# Patient Record
Sex: Female | Born: 1990 | Race: Black or African American | Hispanic: No | Marital: Single | Smoking: Former smoker
Health system: Southern US, Community
[De-identification: ages and names within clinical notes are randomized; demographics above are authoritative.]

## PROBLEM LIST (undated history)

## (undated) ENCOUNTER — Inpatient Hospital Stay (HOSPITAL_COMMUNITY): Payer: Self-pay

## (undated) DIAGNOSIS — F419 Anxiety disorder, unspecified: Secondary | ICD-10-CM

## (undated) DIAGNOSIS — N83209 Unspecified ovarian cyst, unspecified side: Secondary | ICD-10-CM

## (undated) DIAGNOSIS — A749 Chlamydial infection, unspecified: Secondary | ICD-10-CM

## (undated) DIAGNOSIS — D649 Anemia, unspecified: Secondary | ICD-10-CM

## (undated) DIAGNOSIS — G43909 Migraine, unspecified, not intractable, without status migrainosus: Secondary | ICD-10-CM

## (undated) DIAGNOSIS — K219 Gastro-esophageal reflux disease without esophagitis: Secondary | ICD-10-CM

## (undated) HISTORY — DX: Migraine, unspecified, not intractable, without status migrainosus: G43.909

## (undated) HISTORY — DX: Unspecified ovarian cyst, unspecified side: N83.209

## (undated) HISTORY — DX: Anxiety disorder, unspecified: F41.9

---
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
## 2011-04-05 ENCOUNTER — Emergency Department (HOSPITAL_COMMUNITY)
Admission: EM | Admit: 2011-04-05 | Discharge: 2011-04-05 | Discharge status: Against Medical Advice | Payer: Self-pay | Attending: Emergency Medicine | Admitting: Emergency Medicine

## 2011-04-05 ENCOUNTER — Encounter (HOSPITAL_COMMUNITY): Payer: Self-pay | Admitting: *Deleted

## 2011-04-05 DIAGNOSIS — R0609 Other forms of dyspnea: Secondary | ICD-10-CM | POA: Insufficient documentation

## 2011-04-05 DIAGNOSIS — R0989 Other specified symptoms and signs involving the circulatory and respiratory systems: Secondary | ICD-10-CM | POA: Insufficient documentation

## 2011-04-05 NOTE — ED Notes (Signed)
Pt states "last night I felt like I was unable to breath, I don't have asthma or anything, I did have it today once around 1 or 2om

## 2011-09-15 ENCOUNTER — Ambulatory Visit: Payer: Medicaid Other | Admitting: Advanced Practice Midwife

## 2011-09-23 ENCOUNTER — Encounter: Payer: Self-pay | Admitting: Obstetrics & Gynecology

## 2011-09-23 ENCOUNTER — Ambulatory Visit (INDEPENDENT_AMBULATORY_CARE_PROVIDER_SITE_OTHER): Payer: Medicaid Other | Admitting: Obstetrics & Gynecology

## 2011-09-23 ENCOUNTER — Other Ambulatory Visit: Payer: Self-pay | Admitting: Obstetrics & Gynecology

## 2011-09-23 VITALS — BP 114/66 | HR 89 | Temp 97.2°F | Ht 67.0 in | Wt 175.1 lb

## 2011-09-23 DIAGNOSIS — R102 Pelvic and perineal pain unspecified side: Secondary | ICD-10-CM | POA: Insufficient documentation

## 2011-09-23 DIAGNOSIS — B373 Candidiasis of vulva and vagina: Secondary | ICD-10-CM

## 2011-09-23 DIAGNOSIS — N949 Unspecified condition associated with female genital organs and menstrual cycle: Secondary | ICD-10-CM

## 2011-09-23 LAB — POCT URINALYSIS DIP (DEVICE)
Bilirubin Urine: NEGATIVE
Glucose, UA: NEGATIVE mg/dL
Ketones, ur: NEGATIVE mg/dL
Specific Gravity, Urine: 1.02 (ref 1.005–1.030)

## 2011-09-23 MED ORDER — TERCONAZOLE 0.4 % VA CREA: 1.0000 | TOPICAL_CREAM | Freq: Every day | VAGINAL | Status: AC

## 2011-09-23 NOTE — Patient Instructions (Signed)
Pelvic Pain in Women, Generic  Pelvic pain may be constant or come and go. It may be mild or severe. It is important to tell your caregiver exactly where the pain is located, when and how it occurs, and if it is related to your menstrual periods or stress. We have not found a definite cause for your pelvic pain today and you may need follow-up testing and examination.  CAUSES    Sexually transmitted diseases (STDS) cause pelvic inflammatory disease (PID). This is one of the most common causes of pelvic pain. It is an infection of the female sexual organs.   Endometriosis - This is a condition where some of the inside lining of the uterus is growing in the pelvis and abdomen outside the uterus. Along with (chronic) pain, this can cause infertility.   Tubal pregnancy - This is a serious condition where the pregnancy has occurred in a fallopian tube. Rupture of the tube can bleed heavily and cause death if it is not found in time.   Interstitial cystitis is an inflammation of the bladder that causes pelvic pain. People with severe cases of IC may urinate as many as 60 times a day.   Fibroids: A small percentage of women have uterine fibroids (non-cancerous smooth muscle growths in the uterus). Fibroids do not always cause pain.   Fibromyalgia is a disorder with symptoms of widespread muscle pain, fatigue and multiple tender points on the body.   Dysmenorrhea is painful menstrual periods.   Mittlesmertz is pain with ovulation.   Pelvic congestive syndrome, is engorgement of the pelvic veins just before and during a menstrual period.   Cervical stenosis is when the opening of the cervix is too small and causes pain during menstruation.   Adenomyosis (a type of endometriosis) glands that line the inside of the uterus lying in the muscle layer of the uterus.   Intestinal problems such as irritable bowel syndrome colitis or ileitis.   Appendicitis.   Pelvic cancer. Usually the cancer has been there for awhile  before causing pain.   Bladder infection.   Cysts or ovarian tumors.   Kidney stone.   Psychological factors (stress, sexual abuse or depression).   IUD (intrauterine device).   Prolapse (falling down of the uterus).   Retroflexed uterus - the uterus is tipped too far backwards.   Muscle spasms of the pelvic muscles.   Muscular-skeletal problems of the back (herniated disc).  DIAGNOSIS    Your caregiver may order testing, such as:   Blood tests.   Cultures to test for infection.   Ultrasound.   Looking into the bladder with a metal tube with a light (cystoscopy).   Looking into the pelvis and abdomen with very small incisions through a metal tube with a light (laparoscopy).   Looking into the large intestine with a fibro-optic tube with a light (colonoscopy).   CT scan - a type of X-ray to view the internal organs of the pelvis and abdomen.   MRI - views the pelvic and abdominal organs with a magnetic machine.   Intravenous pyelogram - views the kidneys, ureter and bladder after injecting dye through the vein by X-rays.   Injecting barium into the large intestine to view the intestine with X-rays (barium enema).   Not all test results are available during your visit. If your test results are not back during the visit, make an appointment with your caregiver or the medical facility. It is important for you to follow up   on all of your test results.  TREATMENT   Treatment will depend on the cause of the pain, such as:   Medication, antibiotics, pain medication, muscle relaxants, anti-depression drugs, hormones or birth control pills.   Physical therapy.   Acupuncture.   Psychiatric counseling.   Nerve blocks.   Surgery.  HOME CARE INSTRUCTIONS    Finish all medication as prescribed. Incomplete treatment will put you at risk for sterility and tubal pregnancy if your caregiver feels your pain is caused by an infection.   Rest and eat a balanced diet with plenty of fluids.   If you do have an  infection, your recent sexual partners may need treatment even if they are symptom-free or have a negative culture or evaluation. You also need follow-up to make sure you are no longer infected.   Only take over-the-counter or prescription medicines for pain, discomfort or fever as directed by your caregiver.   Apply warm or cold compresses to the lower abdomen depending on which one helps the pain.   Avoid stressful situations that may cause the pain.   Group therapy is sometimes helpful.   Make sure to follow all instructions. Some of the conditions listed above can have very serious outcomes if you do not take the time to follow-up with your caregiver.  SEEK IMMEDIATE MEDICAL CARE IF:    There is heavier bleeding from the birth canal (vagina).   You develop increasing abdominal pain.   You feel lightheaded or pass out.   An unexplained oral temperature above 102 F (38.9 C) develops.   Any of the problems which brought you to us are getting worse.   You are being physically or sexually abused.   You have painful urination.   You are still having pain four hours after taking prescription pain medication.   You have uncontrolled diarrhea.   You have abnormal vaginal discharge.  Document Released: 01/19/2004 Document Revised: 02/10/2011 Document Reviewed: 02/19/2008  ExitCare Patient Information 2012 [employer].

## 2011-09-23 NOTE — Progress Notes (Signed)
Patient ID: Veronica Hines, female   DOB: 1990/11/06, 21 y.o.   MRN: [id number]  Chief Complaint  Patient presents with  . Pelvic Pain    HPI Veronica Hines is a 21 y.o. female.  LMP 09/09/11, Implanon removed in Jan. Intermittent pelvic pain since then. Irregular periods. Vaginal itching, discharge HPI  Past Medical History  Diagnosis Date  . Ovarian cyst     History reviewed. No pertinent past surgical history.  Family History  Problem Relation Age of Onset  . Diabetes Maternal Grandmother   . Cancer Maternal Grandfather     lung    Social History History  Substance Use Topics  . Smoking status: Passive Smoker  . Smokeless tobacco: Never Used  . Alcohol Use: No    No Known Allergies  Current Outpatient Prescriptions  Medication Sig Dispense Refill  . Multiple Vitamin (MULTIVITAMIN) capsule Take 1 capsule by mouth daily. Women's one a day      . METROGEL VAGINAL 0.75 % vaginal gel 1 applicatorful per vagina q HS X5 days  70 g  1  . terconazole (TERAZOL 7) 0.4 % vaginal cream Place 1 applicator vaginally at bedtime.  45 g  0  . Vaginal Lubricant (REPHRESH) GEL Place 1 Applicatorful vaginally 3 (three) times a week.  2 g  prn    Review of Systems Review of Systems  Constitutional: Negative.   Respiratory: Negative.   Gastrointestinal: Negative for vomiting, constipation and abdominal distention.  Genitourinary: Positive for urgency, vaginal discharge, menstrual problem and pelvic pain.    Blood pressure 114/66, pulse 89, temperature 97.2 F (36.2 C), temperature source Oral, height 5\' 7"  (1.702 m), weight 175 lb 1.6 oz (79.425 kg), last menstrual period 09/09/2011.  Physical Exam Physical Exam  Constitutional: She is oriented to person, place, and time. She appears well-developed. No distress.  Abdominal: Soft. She exhibits no distension and no mass. There is no tenderness.  Genitourinary: Vaginal discharge found.       White vaginal discharge, c/w yeast. Cx  nl, NT non mass. STD probe and wet prep  Neurological: She is alert and oriented to person, place, and time.  Skin: Skin is warm and dry.  Psychiatric: She has a normal mood and affect. Her behavior is normal.    Data Reviewed [place] 08/2010  Assessment   Yeast vaginitis Pelvic pain. Wants to conceive. US pelvis, Terazol vaginal, RTC 4 weeks    Plan    4 week RTC. Check urine culture.       Veronica Hines 09/23/2011, 9:13 AM

## 2011-09-24 LAB — URINALYSIS, ROUTINE W REFLEX MICROSCOPIC
Glucose, UA: NEGATIVE mg/dL
Leukocytes, UA: NEGATIVE
Nitrite: NEGATIVE
Specific Gravity, Urine: 1.019 (ref 1.005–1.030)
pH: 7 (ref 5.0–8.0)

## 2011-09-24 LAB — WET PREP, GENITAL

## 2011-09-24 LAB — GC/CHLAMYDIA PROBE AMP, GENITAL: GC Probe Amp, Genital: NEGATIVE

## 2011-09-25 LAB — CULTURE, URINE COMPREHENSIVE: Colony Count: 75000

## 2011-10-21 ENCOUNTER — Ambulatory Visit: Payer: Medicaid Other | Admitting: Obstetrics & Gynecology

## 2011-11-11 ENCOUNTER — Encounter: Payer: Self-pay | Admitting: Obstetrics & Gynecology

## 2011-11-11 ENCOUNTER — Ambulatory Visit (INDEPENDENT_AMBULATORY_CARE_PROVIDER_SITE_OTHER): Payer: Medicaid Other | Admitting: Obstetrics & Gynecology

## 2011-11-11 VITALS — BP 124/74 | HR 103 | Temp 98.2°F | Ht 67.0 in | Wt 178.0 lb

## 2011-11-11 DIAGNOSIS — A46 Erysipelas: Secondary | ICD-10-CM

## 2011-11-11 DIAGNOSIS — N760 Acute vaginitis: Secondary | ICD-10-CM

## 2011-11-11 MED ORDER — SULFAMETHOXAZOLE-TRIMETHOPRIM 800-160 MG PO TABS: 1.0000 | ORAL_TABLET | Freq: Two times a day (BID) | ORAL | Status: AC

## 2011-11-11 NOTE — Patient Instructions (Signed)
Staphylococcal Infections  Staphylococcus aureus (Staph) is a germ that may cause infections, especially on broken skin or wounds. Methicillin is a drug sometimes used to treat Staph infections. If the germ is resistant to methicillin, it is called MRSA. Methicillin and some other drugs may not work to treat the infection. However, there are other antibiotic drugs that may be used that will treat the infection. Your caregiver may do a culture from your wound, skin, or other site. This will tell him/her that you have MRSA present. Sometimes healthy people carry MRSA, but it may also cause an infection.  Staph infections, including MRSA can spread from one person to another by contact with an infected person. You may prevent spreading an MRSA infection to those you live with or others around you by following these steps:  Keep infections and pus or drainage material covered with clean dry bandages. Follow your caregiver's instructions on proper care of the wound. Pus from infected wounds can contain MRSA and spread the germ (bacteria) to others.   Advise your family and other close contacts to wash their hands frequently with soap and warm water. This should be done especially if they change your bandages or touch the infected wound or infectious materials.   Avoid sharing personal items (towels, washcloth, razor, clothing, or uniforms) that may have had contact with the infected wound.   Wash linens and clothes that become soiled, with hot water and laundry detergent. Drying clothes in a hot dryer, rather than air-drying, also helps kill bacteria in clothes.   Tell any healthcare providers who treat you that you have an MRSA infection. In the hospital steps will be taken to prevent the spread of MRSA.   Ask your caregiver about return to school or return to work if you have a Staph or MRSA infection.   If your caregiver has given you a follow-up appointment, it is very important to keep that  appointment. Not keeping the appointment could result in a chronic or permanent injury, pain, and disability. If there is any problem keeping the appointment, you must call back to this facility for assistance.  Staph and MRSA infections can become very serious and you should contact your caregiver if your infection gets worse. To fight the infection, follow your doctor's instructions for wound care and take all medicines as prescribed. SEEK MEDICAL CARE IF:   You have increased pus coming from the wound.   You have a fever.   You notice a bad smell coming from the wound or dressing.  SEEK IMMEDIATE MEDICAL CARE IF:  You have redness, red streaks, swelling, or increasing pain in the wound. Document Released: 05/14/2002 Document Revised: 02/10/2011 Document Reviewed: 10/08/2007 ExitCare Patient Information 2012 [employer]. 

## 2011-11-11 NOTE — Progress Notes (Signed)
Subjective:     Patient ID: Veronica Hines, female   DOB: February 11, 1991, 21 y.o.   MRN: [id number]  HPI  Pt here for f/u of pelvic pain which has completely resolved.  Pt noted an infected bug bite on her face since yesterday.  No fever or chills.   Pt c/o occ odor vaginally. None currently.  Dx'd with a yeast infxn several months ago but, did not get rx called in pt unsure if she still has infxn.  No abnl discharge or sx.     Review of Systems n/c     Objective:   Physical ExamBP 124/74  Pulse 103  Temp 98.2 F (36.8 C) (Oral)  Ht 5\' 7"  (1.702 m)  Wt 178 lb (80.74 kg)  BMI 27.88 kg/m2  LMP 10/25/2011  HEENT: honey crusted lesion on right cheek surrounded by erythema     Assessment:     Patch of erysipelas on face Allergic vaginitis- reviewed normal physiologic discharge   Plan:     Bactrim DS 1 po bid x 7 days F/u 1 year for annual reveiwed with pt GO WHITE  Christopherjame Carnell L. Harraway-Smith, M.D., [place]

## 2011-12-30 ENCOUNTER — Emergency Department (INDEPENDENT_AMBULATORY_CARE_PROVIDER_SITE_OTHER)
Admission: EM | Admit: 2011-12-30 | Discharge: 2011-12-30 | Disposition: A | Discharge status: Home / Self Care | Payer: Self-pay | Source: Home / Self Care | Attending: Emergency Medicine | Admitting: Emergency Medicine

## 2011-12-30 ENCOUNTER — Encounter (HOSPITAL_COMMUNITY): Payer: Self-pay | Admitting: *Deleted

## 2011-12-30 DIAGNOSIS — R202 Paresthesia of skin: Secondary | ICD-10-CM

## 2011-12-30 DIAGNOSIS — R209 Unspecified disturbances of skin sensation: Secondary | ICD-10-CM

## 2011-12-30 LAB — GLUCOSE, CAPILLARY: Glucose-Capillary: 82 mg/dL (ref 70–99)

## 2011-12-30 NOTE — ED Notes (Signed)
[facility] called about pt. with L arm and hand numbness.  [facility] Alert EMT sent to waiting room to check pt. Veronica Hines M

## 2011-12-30 NOTE — ED Notes (Signed)
Pt  Reports   Had      Episode  Of  l     Arm  Tingling         With  Some  Numbness         Today        denys  Any  Injury    -  She  Reports  The  Pain radiates     Up  The  Arm          She       Reports   She   Had  Similar  Episode in  The  Past  Which  Resolved             She  Appears  Pleasant  And  Is  In no  Distress

## 2011-12-30 NOTE — ED Provider Notes (Signed)
History     CSN: [id number]  Arrival date & time 12/30/11  1604   First MD Initiated Contact with Patient 12/30/11 1609      Chief Complaint  Patient presents with  . Numbness    (Consider location/radiation/quality/duration/timing/severity/associated sxs/prior treatment) HPI Comments: Patient presents urgent care tonight describing several days of ongoing numbness and tingling sensations on her left upper extremity most noticeable today. She describes she works in a [employer], [employer],  For many hours. And she also theshe has had this sensations before and they have gone away on their own. At this point patient denies any other further symptoms such as weakness of her left upper extremity or opposite arm. No lower extremity weakness, no speech problems, no headaches, no visual changes, no facial paresthesias, patient also denies any recent trauma to her left upper extremity denies any wrist elbow or shoulder pain. Patient also denies constitutional symptoms such as fevers, myalgias arthralgias, unintentional weight loss, changes in appetite or frequent headaches. She's not certain about what triggers this but he seems to go away for hours or minutes at a time in fact at the moment of interview and physical exam she was not feeling any abnormality.  The history is provided by the patient.    Past Medical History  Diagnosis Date  . Ovarian cyst     History reviewed. No pertinent past surgical history.  Family History  Problem Relation Age of Onset  . Diabetes Maternal Grandmother   . Cancer Maternal Grandfather     lung    History  Substance Use Topics  . Smoking status: Passive Smoke Exposure - Never Smoker  . Smokeless tobacco: Never Used  . Alcohol Use: No    OB History    Grav Para Term Preterm Abortions TAB SAB Ect Mult Living   0               Review of Systems  Constitutional: Negative for fever, chills, activity change, appetite change and fatigue.    Musculoskeletal: Positive for extremity weakness. Negative for myalgias, back pain, joint swelling and arthralgias.  Skin: Negative for color change, rash and wound.  Neurological: Positive for numbness. Negative for dizziness, tremors, syncope, facial asymmetry, speech difficulty, weakness and headaches.  Psychiatric/Behavioral: Negative for behavioral problems and agitation.    Allergies  Review of patient's allergies indicates no known allergies.  Home Medications   Current Outpatient Rx  Name Route Sig Dispense Refill  . METROGEL-VAGINAL 0.75 % VA GEL  1 applicatorful per vagina q HS X5 days 70 g 1    Dispense as written.  . MULTIVITAMINS PO CAPS Oral Take 1 capsule by mouth daily. Women's one a day    . REPHRESH VA GEL Vaginal Place 1 Applicatorful vaginally 3 (three) times a week. 2 g prn    BP 118/65  Pulse 86  Temp 98.2 F (36.8 C) (Oral)  Resp 16  SpO2 100%  LMP 12/08/2011  Physical Exam  Nursing note and vitals reviewed. Constitutional: She is oriented to person, place, and time. She appears well-developed and well-nourished.  Neck: Normal range of motion. Neck supple. No JVD present.  Musculoskeletal: She exhibits no edema and no tenderness.  Lymphadenopathy:    She has no cervical adenopathy.  Neurological: She is alert and oriented to person, place, and time. She has normal strength. She displays no atrophy, no tremor and normal reflexes. No cranial nerve deficit or sensory deficit. She exhibits normal muscle tone. She displays a negative  Romberg sign. Coordination and gait normal. GCS eye subscore is 4. GCS verbal subscore is 5. GCS motor subscore is 6.  Reflex Scores:      Brachioradialis reflexes are 3+ on the right side and 3+ on the left side.      Further sensorial exam patient has conserved, proprioception, able to discriminate 2 point tactile discrimination, able to perceive pressure, and temperature changes. Patient has a normal biceps and triceps muscular  strength. Is able to flex and extend her wrist and supinate and pronate her elbow without difficulty  Skin: No rash noted. No erythema.    ED Course  Procedures (including critical care time)  Labs Reviewed - No data to display No results found.   1. Paresthesia of arm       MDM  Possibly paresthesia to left upper extremity (recurrent), secondary to over usage of left upper extremity. Patient had no further neurological symptoms and had a normal neurological and musculoskeletal exam. In fact that point of evaluation patient seemed to be asymptomatic as per own report, denies any weakness or pain. Have encouraged patient to followup with neurologist if her pains persist and she's unable to elicit any pain or discomfort in any of her upper extremity joints and neck. She agrees with plan of care and followup care as necessary with either her primary care Dr. or the neurologist. We also discussed in detail the symptoms were to constitute red flags for an immediate evaluation in the emergency department she acknowledged and understands and will pursue such course if any other new symptoms     Jimmie Molly, MD 12/30/11 1731

## 2011-12-30 NOTE — ED Notes (Addendum)
Pt assessed in response to registration request due to c/o lt arm numbness. When pt was questioned about symptoms she explained that she now has tingling hand and fingers, but that numb/tingling sensation was higher in her arm earlier today. Pt. Complains of no other symptoms.

## 2012-02-27 ENCOUNTER — Ambulatory Visit (INDEPENDENT_AMBULATORY_CARE_PROVIDER_SITE_OTHER): Payer: Medicaid Other | Admitting: Family Medicine

## 2012-02-27 ENCOUNTER — Encounter: Payer: Self-pay | Admitting: Family Medicine

## 2012-02-27 VITALS — BP 122/79 | HR 94 | Temp 98.5°F | Ht 69.0 in | Wt 191.0 lb

## 2012-02-27 DIAGNOSIS — Z3009 Encounter for other general counseling and advice on contraception: Secondary | ICD-10-CM

## 2012-02-27 DIAGNOSIS — R12 Heartburn: Secondary | ICD-10-CM | POA: Insufficient documentation

## 2012-02-27 DIAGNOSIS — G43909 Migraine, unspecified, not intractable, without status migrainosus: Secondary | ICD-10-CM

## 2012-02-27 HISTORY — DX: Heartburn: R12

## 2012-02-27 MED ORDER — OMEPRAZOLE 40 MG PO CPDR: 40.0000 mg | DELAYED_RELEASE_CAPSULE | Freq: Every day | ORAL | Status: DC

## 2012-02-27 NOTE — Patient Instructions (Addendum)
Thank you for coming in today You are doing very well Remember to take a prenatal vitamin every day if you are trying to get pregnant Please start taking your omeprazole daily for your heart burn or chest pain Please come back to see me as needed or call 24/7 if an emergency arises Have a Merry Christmas and a Happy New Year.   Gastroesophageal Reflux Disease, Adult Gastroesophageal reflux disease (GERD) happens when acid from your stomach flows up into the esophagus. When acid comes in contact with the esophagus, the acid causes soreness (inflammation) in the esophagus. Over time, GERD may create small holes (ulcers) in the lining of the esophagus. CAUSES   Increased body weight. This puts pressure on the stomach, making acid rise from the stomach into the esophagus.  Smoking. This increases acid production in the stomach.  Drinking alcohol. This causes decreased pressure in the lower esophageal sphincter (valve or ring of muscle between the esophagus and stomach), allowing acid from the stomach into the esophagus.  Late evening meals and a full stomach. This increases pressure and acid production in the stomach.  A malformed lower esophageal sphincter. Sometimes, no cause is found. SYMPTOMS   Burning pain in the lower part of the mid-chest behind the breastbone and in the mid-stomach area. This may occur twice a week or more often.  Trouble swallowing.  Sore throat.  Dry cough.  Asthma-like symptoms including chest tightness, shortness of breath, or wheezing. DIAGNOSIS  Your caregiver may be able to diagnose GERD based on your symptoms. In some cases, X-rays and other tests may be done to check for complications or to check the condition of your stomach and esophagus. TREATMENT  Your caregiver may recommend over-the-counter or prescription medicines to help decrease acid production. Ask your caregiver before starting or adding any new medicines.  HOME CARE INSTRUCTIONS    Change the factors that you can control. Ask your caregiver for guidance concerning weight loss, quitting smoking, and alcohol consumption.  Avoid foods and drinks that make your symptoms worse, such as:  Caffeine or alcoholic drinks.  Chocolate.  Peppermint or mint flavorings.  Garlic and onions.  Spicy foods.  Citrus fruits, such as oranges, lemons, or limes.  Tomato-based foods such as sauce, chili, salsa, and pizza.  Fried and fatty foods.  Avoid lying down for the 3 hours prior to your bedtime or prior to taking a nap.  Eat small, frequent meals instead of large meals.  Wear loose-fitting clothing. Do not wear anything tight around your waist that causes pressure on your stomach.  Raise the head of your bed 6 to 8 inches with wood blocks to help you sleep. Extra pillows will not help.  Only take over-the-counter or prescription medicines for pain, discomfort, or fever as directed by your caregiver.  Do not take aspirin, ibuprofen, or other nonsteroidal anti-inflammatory drugs (NSAIDs). SEEK IMMEDIATE MEDICAL CARE IF:   You have pain in your arms, neck, jaw, teeth, or back.  Your pain increases or changes in intensity or duration.  You develop nausea, vomiting, or sweating (diaphoresis).  You develop shortness of breath, or you faint.  Your vomit is green, yellow, black, or looks like coffee grounds or blood.  Your stool is red, bloody, or black. These symptoms could be signs of other problems, such as heart disease, gastric bleeding, or esophageal bleeding. MAKE SURE YOU:   Understand these instructions.  Will watch your condition.  Will get help right away if you are not  doing well or get worse. Document Released: 12/01/2004 Document Revised: 05/16/2011 Document Reviewed: 09/10/2010 [facility] Patient Information 2013 [place], [place].

## 2012-02-27 NOTE — Assessment & Plan Note (Signed)
Omeprazole for 14 days

## 2012-03-05 ENCOUNTER — Encounter: Payer: Self-pay | Admitting: Family Medicine

## 2012-03-05 DIAGNOSIS — Z3009 Encounter for other general counseling and advice on contraception: Secondary | ICD-10-CM

## 2012-03-05 DIAGNOSIS — G43909 Migraine, unspecified, not intractable, without status migrainosus: Secondary | ICD-10-CM | POA: Insufficient documentation

## 2012-03-05 HISTORY — DX: Encounter for other general counseling and advice on contraception: Z30.09

## 2012-03-05 NOTE — Progress Notes (Signed)
Veronica Hines is a 21 y.o. female who presents to [facility] today for new patient visit and preconception counseling  Family Planning: Pt sexually active w/ one partner for >36yr. Last period 02/17/12. Came off [place] several months ago. No previous pregnancies. NOt taking PNV. Stable support at home per pt. Pr is not protecting  HEart burn: present for 66mo. Worsens w/ stress, or lying down. No exertional CP, SOB, lightheadedness, syncope, diaphoresis. Occasinally wakes at night. Has not taken any medications to relieve symptoms. No particular trigger foods.   MIgraines. Concern for pt for several years. HA associated w/ photophobia and phonophobia. NO LOC, AMS. Relieved by rest and 200mg  Ibuprofen and or tylenol.    The following portions of the patient's history were reviewed and updated as appropriate: allergies, current medications, past medical history, family and social history, and problem list.  Patient is a smoker  Past Medical History  Diagnosis Date  . Ovarian cyst     ROS as above otherwise neg.    Medications reviewed. Current Outpatient Prescriptions  Medication Sig Dispense Refill  . METROGEL VAGINAL 0.75 % vaginal gel 1 applicatorful per vagina q HS X5 days  70 g  1  . Multiple Vitamin (MULTIVITAMIN) capsule Take 1 capsule by mouth daily. Women's one a day      . omeprazole (PRILOSEC) 40 MG capsule Take 1 capsule (40 mg total) by mouth daily.  14 capsule  0  . Vaginal Lubricant (REPHRESH) GEL Place 1 Applicatorful vaginally 3 (three) times a week.  2 g  prn    Exam: BP 122/79  Pulse 94  Temp 98.5 F (36.9 C) (Oral)  Ht 5\' 9"  (1.753 m)  Wt 191 lb (86.637 kg)  BMI 28.21 kg/m2  LMP 02/17/2012 Gen: Well NAD, obese HEENT: EOMI,  MMM Lungs: CTABL Nl WOB Heart: RRR no MRG Abd: NABS, NT, ND Exts: Non edematous BL  LE, warm and well perfused.   No results found for this or any previous visit (from the past 72 hour(s)).

## 2012-03-05 NOTE — Assessment & Plan Note (Signed)
NO intervention at this time COntinue NSAID and Tylenol PRN

## 2012-03-05 NOTE — Assessment & Plan Note (Addendum)
Pt to start taking PNV Pt and boyfriend are going to continue to have intercourse w/o protection Pt to come in for [facility] if/when pregnant

## 2012-06-08 ENCOUNTER — Encounter (HOSPITAL_COMMUNITY): Payer: Self-pay

## 2012-06-08 ENCOUNTER — Inpatient Hospital Stay (HOSPITAL_COMMUNITY)
Admission: AD | Admit: 2012-06-08 | Discharge: 2012-06-08 | Disposition: A | Discharge status: Home / Self Care | Payer: Self-pay | Source: Ambulatory Visit | Attending: Obstetrics and Gynecology | Admitting: Obstetrics and Gynecology

## 2012-06-08 DIAGNOSIS — R109 Unspecified abdominal pain: Secondary | ICD-10-CM | POA: Insufficient documentation

## 2012-06-08 DIAGNOSIS — N949 Unspecified condition associated with female genital organs and menstrual cycle: Secondary | ICD-10-CM | POA: Insufficient documentation

## 2012-06-08 DIAGNOSIS — N760 Acute vaginitis: Secondary | ICD-10-CM | POA: Insufficient documentation

## 2012-06-08 DIAGNOSIS — A499 Bacterial infection, unspecified: Secondary | ICD-10-CM | POA: Insufficient documentation

## 2012-06-08 DIAGNOSIS — B3731 Acute candidiasis of vulva and vagina: Secondary | ICD-10-CM

## 2012-06-08 DIAGNOSIS — A599 Trichomoniasis, unspecified: Secondary | ICD-10-CM

## 2012-06-08 DIAGNOSIS — B9689 Other specified bacterial agents as the cause of diseases classified elsewhere: Secondary | ICD-10-CM

## 2012-06-08 DIAGNOSIS — B373 Candidiasis of vulva and vagina: Secondary | ICD-10-CM | POA: Insufficient documentation

## 2012-06-08 DIAGNOSIS — A5901 Trichomonal vulvovaginitis: Secondary | ICD-10-CM | POA: Insufficient documentation

## 2012-06-08 LAB — WET PREP, GENITAL

## 2012-06-08 LAB — URINALYSIS, ROUTINE W REFLEX MICROSCOPIC
Bilirubin Urine: NEGATIVE
Ketones, ur: NEGATIVE mg/dL
Nitrite: NEGATIVE
Urobilinogen, UA: 0.2 mg/dL (ref 0.0–1.0)
pH: 6 (ref 5.0–8.0)

## 2012-06-08 LAB — COMPREHENSIVE METABOLIC PANEL
ALT: 10 U/L (ref 0–35)
AST: 16 U/L (ref 0–37)
Calcium: 9.4 mg/dL (ref 8.4–10.5)
Sodium: 137 mEq/L (ref 135–145)
Total Protein: 6.8 g/dL (ref 6.0–8.3)

## 2012-06-08 LAB — URINE MICROSCOPIC-ADD ON

## 2012-06-08 LAB — CBC
MCH: 25.1 pg — ABNORMAL LOW (ref 26.0–34.0)
MCHC: 32.5 g/dL (ref 30.0–36.0)
Platelets: 254 10*3/uL (ref 150–400)

## 2012-06-08 MED ORDER — FLUCONAZOLE 150 MG PO TABS: 150.0000 mg | ORAL_TABLET | Freq: Once | ORAL | Status: DC

## 2012-06-08 MED ORDER — METRONIDAZOLE 500 MG PO TABS: 500.0000 mg | ORAL_TABLET | Freq: Two times a day (BID) | ORAL | Status: DC

## 2012-06-08 NOTE — MAU Note (Signed)
Pain in lower abdominal area both sides and back for about 1 month

## 2012-06-08 NOTE — MAU Provider Note (Signed)
Attestation of Attending Supervision of Advanced Practitioner (CNM/NP): Evaluation and management procedures were performed by the Advanced Practitioner under my supervision and collaboration.  I have reviewed the Advanced Practitioner's note and chart, and I agree with the management and plan.  Suesan Mohrmann 06/08/2012 7:36 PM

## 2012-06-08 NOTE — MAU Provider Note (Signed)
History     CSN: [id number]  Arrival date and time: 06/08/12 1546   First Provider Initiated Contact with Patient 06/08/12 1610      Chief Complaint  Patient presents with  . Pelvic Pain  . Back Pain   HPI Veronica Hines is 22 y.o. female that presents with approximately 1 month of intermittent flank and pelvic pain. Pt states she feels like she "has to pee every 5 minutes and is only peeing a little" when she voids. Sexually active with 1 partner, using condoms. Not using contraception. Denies vaginal discharge or bleeding, has regular periods. Pt also states she has been having some nausea with eating for the past month.   OB History   Grav Para Term Preterm Abortions TAB SAB Ect Mult Living   0               Past Medical History  Diagnosis Date  . Ovarian cyst   . Migraine     No past surgical history on file.  Family History  Problem Relation Age of Onset  . Diabetes Maternal Grandmother   . Cancer Maternal Grandfather     lung  . Cancer Mother     Lung Cancer - smoker  . Depression Mother   . Hypertension Mother   . Anxiety disorder Mother   . Bipolar disorder Mother   . Bipolar disorder Father   . Hypertension Maternal Aunt   . Hypertension Maternal Uncle   . Diabetes Maternal Uncle   . Hypertension Paternal Aunt   . Diabetes Paternal Aunt   . Hypertension Paternal Uncle   . Diabetes Paternal Uncle     History  Substance Use Topics  . Smoking status: Current Some Day Smoker  . Smokeless tobacco: Never Used  . Alcohol Use: No    Allergies: No Known Allergies  Prescriptions prior to admission  Medication Sig Dispense Refill  . ibuprofen (ADVIL,MOTRIN) 200 MG tablet Take 400 mg by mouth every 6 (six) hours as needed for pain or headache.      . Multiple Vitamin (MULTIVITAMIN) capsule Take 1 capsule by mouth daily. Women's one a day        Review of Systems  Constitutional: Positive for fever. Negative for chills.  Respiratory: Negative.    Cardiovascular: Negative.   Gastrointestinal: Positive for heartburn, nausea (with food) and abdominal pain. Negative for vomiting, diarrhea, constipation, blood in stool and melena.  Genitourinary: Positive for urgency, frequency and flank pain. Negative for dysuria and hematuria.  Neurological: Negative.   Psychiatric/Behavioral: Negative.    Physical Exam   Blood pressure 132/76, pulse 118, temperature 101.4 F (38.6 C), temperature source Oral, resp. rate 18, height 5\' 7"  (1.702 m), weight 85.186 kg (187 lb 12.8 oz), last menstrual period 05/26/2012.  Physical Exam  Constitutional: She is oriented to person, place, and time. She appears well-developed and well-nourished.  HENT:  Head: Normocephalic and atraumatic.  Nose: Nose normal.  Eyes: EOM are normal.  Cardiovascular: Normal rate, regular rhythm and intact distal pulses.  Exam reveals no gallop and no friction rub.   No murmur heard. Respiratory: Effort normal and breath sounds normal. No respiratory distress. She has no wheezes. She has no rales. She exhibits no tenderness.  GI: Soft. Bowel sounds are normal. She exhibits no distension and no mass. There is tenderness (mild diffuse tenderness to palpation). There is no rebound and no guarding.  No suprapubic tenderness  Genitourinary: Vagina normal. Uterus is not enlarged and not  tender. Cervix exhibits discharge (moderate amount of off-white frothy discharge at the cervical os and in the vagina). Cervix exhibits no motion tenderness and no friability. Right adnexum displays no mass and no tenderness. Left adnexum displays no mass and no tenderness.  Neurological: She is alert and oriented to person, place, and time. She has normal reflexes.   Results for orders placed during the hospital encounter of 06/08/12 (from the past 24 hour(s))  URINALYSIS, ROUTINE W REFLEX MICROSCOPIC     Status: Abnormal   Collection Time    06/08/12  3:55 PM      Result Value Range   Color, Urine  YELLOW  YELLOW   APPearance CLEAR  CLEAR   Specific Gravity, Urine 1.025  1.005 - 1.030   pH 6.0  5.0 - 8.0   Glucose, UA NEGATIVE  NEGATIVE mg/dL   Hgb urine dipstick TRACE (*) NEGATIVE   Bilirubin Urine NEGATIVE  NEGATIVE   Ketones, ur NEGATIVE  NEGATIVE mg/dL   Protein, ur NEGATIVE  NEGATIVE mg/dL   Urobilinogen, UA 0.2  0.0 - 1.0 mg/dL   Nitrite NEGATIVE  NEGATIVE   Leukocytes, UA TRACE (*) NEGATIVE  URINE MICROSCOPIC-ADD ON     Status: Abnormal   Collection Time    06/08/12  3:55 PM      Result Value Range   Squamous Epithelial / LPF FEW (*) RARE   WBC, UA 3-6  <3 WBC/hpf   RBC / HPF 0-2  <3 RBC/hpf   Bacteria, UA RARE  RARE   Urine-Other TRICHOMONAS PRESENT    POCT PREGNANCY, URINE     Status: None   Collection Time    06/08/12  4:04 PM      Result Value Range   Preg Test, Ur NEGATIVE  NEGATIVE  WET PREP, GENITAL     Status: Abnormal   Collection Time    06/08/12  4:42 PM      Result Value Range   Yeast Wet Prep HPF POC FEW (*) NONE SEEN   Trich, Wet Prep FEW (*) NONE SEEN   Clue Cells Wet Prep HPF POC FEW (*) NONE SEEN   WBC, Wet Prep HPF POC FEW (*) NONE SEEN  CBC     Status: Abnormal   Collection Time    06/08/12  5:05 PM      Result Value Range   WBC 5.6  4.0 - 10.5 K/uL   RBC 4.66  3.87 - 5.11 MIL/uL   Hemoglobin 11.7 (*) 12.0 - 15.0 g/dL   HCT 40.9  81.1 - 91.4 %   MCV 77.3 (*) 78.0 - 100.0 fL   MCH 25.1 (*) 26.0 - 34.0 pg   MCHC 32.5  30.0 - 36.0 g/dL   RDW 78.2  95.6 - 21.3 %   Platelets 254  150 - 400 K/uL  COMPREHENSIVE METABOLIC PANEL     Status: None   Collection Time    06/08/12  5:05 PM      Result Value Range   Sodium 137  135 - 145 mEq/L   Potassium 3.8  3.5 - 5.1 mEq/L   Chloride 103  96 - 112 mEq/L   CO2 26  19 - 32 mEq/L   Glucose, Bld 89  70 - 99 mg/dL   BUN 9  6 - 23 mg/dL   Creatinine, Ser 0.86  0.50 - 1.10 mg/dL   Calcium 9.4  8.4 - 57.8 mg/dL   Total Protein 6.8  6.0 - 8.3 g/dL  Albumin 4.0  3.5 - 5.2 g/dL   AST 16  0 - 37  U/L   ALT 10  0 - 35 U/L   Alkaline Phosphatase 68  39 - 117 U/L   Total Bilirubin 0.6  0.3 - 1.2 mg/dL   GFR calc non Af Amer >90  >90 mL/min   GFR calc Af Amer >90  >90 mL/min     MAU Course  Procedures  MDM UA, speculum, GC/C, wet prep.   Assessment and Plan  A: Bacterial vaginosis Trichomonas Yeast vaginitis Abdominal pain  P: Discharge home Rx for Flagyl and Diflucan sent to patient's pharmacy Patient education about partner treatment for STDs Patient encouraged to follow-up with her PCP if abdominal pain continues following treatment Patient may return to MAU as needed or if her condition were to change or worsen  Rosalee Kaufman 06/08/2012, 4:12 PM   I have seen and evaluated this patient with the PA student. I have edited/added to the above note to reflect my observations and interactions with the patient. I agree with the assessment and plan as written above.   Freddi Starr, PA-C 06/08/2012 6:15 PM

## 2012-06-09 LAB — GC/CHLAMYDIA PROBE AMP: CT Probe RNA: NEGATIVE

## 2012-07-03 ENCOUNTER — Encounter: Payer: Self-pay | Admitting: Family Medicine

## 2012-07-03 ENCOUNTER — Ambulatory Visit (INDEPENDENT_AMBULATORY_CARE_PROVIDER_SITE_OTHER): Payer: Medicaid Other | Admitting: Family Medicine

## 2012-07-03 VITALS — BP 110/70 | HR 100 | Temp 101.3°F | Wt 186.0 lb

## 2012-07-03 DIAGNOSIS — R1032 Left lower quadrant pain: Secondary | ICD-10-CM | POA: Insufficient documentation

## 2012-07-03 DIAGNOSIS — M545 Low back pain: Secondary | ICD-10-CM

## 2012-07-03 HISTORY — DX: Left lower quadrant pain: R10.32

## 2012-07-03 LAB — POCT UA - MICROSCOPIC ONLY

## 2012-07-03 LAB — POCT URINALYSIS DIPSTICK
Bilirubin, UA: NEGATIVE
Glucose, UA: NEGATIVE
Nitrite, UA: NEGATIVE
Spec Grav, UA: 1.015

## 2012-07-03 MED ORDER — ONDANSETRON HCL 4 MG PO TABS: 4.0000 mg | ORAL_TABLET | Freq: Three times a day (TID) | ORAL | Status: DC | PRN

## 2012-07-03 MED ORDER — CEPHALEXIN 500 MG PO CAPS: 500.0000 mg | ORAL_CAPSULE | Freq: Two times a day (BID) | ORAL | Status: DC

## 2012-07-03 NOTE — Progress Notes (Signed)
Patient ID: Veronica Hines, female   DOB: 11-08-1990, 22 y.o.   MRN: [id number]  [facility] Tawnie Ehresman M. Jola Critzer, MD Phone: [phone]   Subjective: HPI: Patient is a 22 y.o. female presenting to clinic today for N/V/abd pain.  ABDOMINAL PAIN  Location: epigastric  Onset: 3 days ago   Radiation: some lower back pain also Severity: 10/10 Quality: sharp, achy  Better with: sleep  Worse with: eating, drinking, lying flat  Symptoms Nausea/Vomiting: yes, since yesterday. Thrown up two times  Diarrhea: no  Constipation: no  Melena/BRBPR: no  Hematemesis: no  Anorexia: yes, had some crackers today. Has not had much to drink. Fever/Chills: yes, febrile to 101.3 today  Dysuria: no  Rash: no  Wt loss: no  EtOH use: no  NSAIDs/ASA: yes, advil for pain  LMP: 06/19/12 normal Vaginal bleeding: no  STD risk/hx: yes, [facility]   No known sick contacts. Goes to school at [employer] on [address]. Lives at home.  Past Surgeries: None  History Reviewed: None smoker. UTD on shots  ROS: Please see HPI above.  Objective: Office vital signs reviewed. LMP 05/26/2012  Physical Examination:  General: Awake, alert. NAD. Appears ill, but not toxic appearing HEENT: Atraumatic, normocephalic. Mildly dry mucus membranes Neck: No masses palpated. No LAD Pulm: CTAB, no wheezes Cardio: tachycardic, no murmurs appreciated Abdomen:+BS, soft, TTP LLQ. No rebound, no guarding Extremities: No edema Neuro: Grossly intact  Assessment: 22 y.o. female acute visit  Plan: See Problem List and After Visit Summary

## 2012-07-03 NOTE — Addendum Note (Signed)
Addended by: Hilarie Fredrickson on: 07/03/2012 06:33 PM   Modules accepted: Orders

## 2012-07-03 NOTE — Patient Instructions (Signed)
Take Tylenol 2 tablets every 8 hours for your fever. Drink more fluids! Rest up, and if you are not feeling better by Thursday morning, come back to see [place].  Amber M. Hairford, M.D.

## 2012-07-03 NOTE — Assessment & Plan Note (Addendum)
Patient with fever and TTP in LLQ. DDx includes viral gastro (No diarrhea or persistent vomiting), UTI (No dysuria), Ovarian etiology (not tender in area) or PID (been tested at [facility] Will check UA and repeat Upreg today. No indication for imaging at this time. Discussed with Dr. Lum Babe. Treat fever with Tylenol and push fluids. Encouraged to rest. Most likely due to viral illness and should run its course, but if she has not improved in 48 hours she should return for evaluation. Also, if she worsens or symptoms change, she should go to the ED for eval.   ADDENDUM: UTI shows many leuks and loaded with bacteria. Will treat for pyleo with Keflex x 10 days. RTC in 2 days if not improved. Patient made aware of this treatment via telephone and all questions answered.

## 2012-07-03 NOTE — Addendum Note (Signed)
Addended by: [place], Makenzie Weisner on: 07/03/2012 06:08 PM   Modules accepted: Orders

## 2012-07-16 ENCOUNTER — Encounter (HOSPITAL_COMMUNITY): Payer: Self-pay | Admitting: *Deleted

## 2012-07-16 ENCOUNTER — Emergency Department (HOSPITAL_COMMUNITY): Payer: No Typology Code available for payment source

## 2012-07-16 ENCOUNTER — Emergency Department (HOSPITAL_COMMUNITY)
Admission: EM | Admit: 2012-07-16 | Discharge: 2012-07-16 | Disposition: A | Discharge status: Home / Self Care | Payer: No Typology Code available for payment source | Attending: Emergency Medicine | Admitting: Emergency Medicine

## 2012-07-16 DIAGNOSIS — M25569 Pain in unspecified knee: Secondary | ICD-10-CM | POA: Insufficient documentation

## 2012-07-16 DIAGNOSIS — Y92410 Unspecified street and highway as the place of occurrence of the external cause: Secondary | ICD-10-CM | POA: Insufficient documentation

## 2012-07-16 DIAGNOSIS — R110 Nausea: Secondary | ICD-10-CM | POA: Insufficient documentation

## 2012-07-16 DIAGNOSIS — Z8679 Personal history of other diseases of the circulatory system: Secondary | ICD-10-CM | POA: Insufficient documentation

## 2012-07-16 DIAGNOSIS — M549 Dorsalgia, unspecified: Secondary | ICD-10-CM | POA: Insufficient documentation

## 2012-07-16 DIAGNOSIS — F17200 Nicotine dependence, unspecified, uncomplicated: Secondary | ICD-10-CM | POA: Insufficient documentation

## 2012-07-16 DIAGNOSIS — Z8742 Personal history of other diseases of the female genital tract: Secondary | ICD-10-CM | POA: Insufficient documentation

## 2012-07-16 DIAGNOSIS — M542 Cervicalgia: Secondary | ICD-10-CM | POA: Insufficient documentation

## 2012-07-16 DIAGNOSIS — Y998 Other external cause status: Secondary | ICD-10-CM | POA: Insufficient documentation

## 2012-07-16 DIAGNOSIS — M25562 Pain in left knee: Secondary | ICD-10-CM

## 2012-07-16 DIAGNOSIS — R51 Headache: Secondary | ICD-10-CM | POA: Insufficient documentation

## 2012-07-16 MED ORDER — IBUPROFEN 800 MG PO TABS: 800.0000 mg | ORAL_TABLET | Freq: Three times a day (TID) | ORAL | Status: DC

## 2012-07-16 MED ORDER — OXYCODONE-ACETAMINOPHEN 5-325 MG PO TABS
2.0000 | ORAL_TABLET | Freq: Once | ORAL | Status: AC
  Administered 2012-07-16: 2 via ORAL
  Filled 2012-07-16: qty 2

## 2012-07-16 MED ORDER — METOCLOPRAMIDE HCL 5 MG/ML IJ SOLN
10.0000 mg | Freq: Once | INTRAMUSCULAR | Status: AC
  Administered 2012-07-16: 10 mg via INTRAMUSCULAR
  Filled 2012-07-16: qty 2

## 2012-07-16 MED ORDER — OXYCODONE-ACETAMINOPHEN 5-325 MG PO TABS: 2.0000 | ORAL_TABLET | ORAL | Status: DC | PRN

## 2012-07-16 MED ORDER — KETOROLAC TROMETHAMINE 60 MG/2ML IM SOLN
60.0000 mg | Freq: Once | INTRAMUSCULAR | Status: AC
  Administered 2012-07-16: 60 mg via INTRAMUSCULAR
  Filled 2012-07-16: qty 2
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
## 2012-11-17 ENCOUNTER — Inpatient Hospital Stay (HOSPITAL_COMMUNITY): Payer: Medicaid Other

## 2012-11-17 ENCOUNTER — Encounter (HOSPITAL_COMMUNITY): Payer: Self-pay | Admitting: *Deleted

## 2012-11-17 ENCOUNTER — Inpatient Hospital Stay (HOSPITAL_COMMUNITY)
Admission: AD | Admit: 2012-11-17 | Discharge: 2012-11-17 | Disposition: A | Discharge status: Home / Self Care | Payer: Medicaid Other | Source: Ambulatory Visit | Attending: Obstetrics & Gynecology | Admitting: Obstetrics & Gynecology

## 2012-11-17 DIAGNOSIS — R109 Unspecified abdominal pain: Secondary | ICD-10-CM | POA: Insufficient documentation

## 2012-11-17 DIAGNOSIS — O26899 Other specified pregnancy related conditions, unspecified trimester: Secondary | ICD-10-CM

## 2012-11-17 DIAGNOSIS — N760 Acute vaginitis: Secondary | ICD-10-CM | POA: Insufficient documentation

## 2012-11-17 DIAGNOSIS — O2390 Unspecified genitourinary tract infection in pregnancy, unspecified trimester: Secondary | ICD-10-CM | POA: Insufficient documentation

## 2012-11-17 DIAGNOSIS — A499 Bacterial infection, unspecified: Secondary | ICD-10-CM | POA: Insufficient documentation

## 2012-11-17 DIAGNOSIS — B9689 Other specified bacterial agents as the cause of diseases classified elsewhere: Secondary | ICD-10-CM | POA: Insufficient documentation

## 2012-11-17 HISTORY — DX: Chlamydial infection, unspecified: A74.9

## 2012-11-17 LAB — CBC
HCT: 32.7 % — ABNORMAL LOW (ref 36.0–46.0)
Hemoglobin: 10.7 g/dL — ABNORMAL LOW (ref 12.0–15.0)
MCV: 75.9 fL — ABNORMAL LOW (ref 78.0–100.0)
RDW: 14 % (ref 11.5–15.5)
WBC: 6.1 10*3/uL (ref 4.0–10.5)

## 2012-11-17 LAB — URINALYSIS, ROUTINE W REFLEX MICROSCOPIC
Bilirubin Urine: NEGATIVE
Glucose, UA: NEGATIVE mg/dL
Ketones, ur: 15 mg/dL — AB
Leukocytes, UA: NEGATIVE
Protein, ur: NEGATIVE mg/dL

## 2012-11-17 LAB — WET PREP, GENITAL
Trich, Wet Prep: NONE SEEN
Yeast Wet Prep HPF POC: NONE SEEN

## 2012-11-17 LAB — URINE MICROSCOPIC-ADD ON

## 2012-11-17 MED ORDER — METRONIDAZOLE 500 MG PO TABS: 500.0000 mg | ORAL_TABLET | Freq: Two times a day (BID) | ORAL | Status: DC

## 2012-11-17 NOTE — MAU Note (Addendum)
Patient states she had a positive HPT and is having some cramping. Denies bleeding. LMP 10/18/2012

## 2012-11-17 NOTE — MAU Provider Note (Signed)
History     CSN: [id number]  Arrival date and time: 11/17/12 2032   First Provider Initiated Contact with Patient 11/17/12 2059      Chief Complaint  Patient presents with  . Possible Pregnancy   HPI  Pt is a G1P0 at [redacted]w[redacted]d weeks IUP here with report of missed period and positive pelvic cramping x 2 days.  Patient's last menstrual period was 10/18/2012.  No report of vaginal bleeding or abnormal vaginal discharge.    Past Medical History  Diagnosis Date  . Ovarian cyst   . Migraine   . Chlamydia     History reviewed. No pertinent past surgical history.  Family History  Problem Relation Age of Onset  . Diabetes Maternal Grandmother   . Cancer Maternal Grandfather     lung  . Cancer Mother     Lung Cancer - smoker  . Depression Mother   . Hypertension Mother   . Anxiety disorder Mother   . Bipolar disorder Mother   . Bipolar disorder Father   . Hypertension Maternal Aunt   . Hypertension Maternal Uncle   . Diabetes Maternal Uncle   . Hypertension Paternal Aunt   . Diabetes Paternal Aunt   . Hypertension Paternal Uncle   . Diabetes Paternal Uncle     History  Substance Use Topics  . Smoking status: Current Some Day Smoker  . Smokeless tobacco: Never Used  . Alcohol Use: No    Allergies: No Known Allergies  Prescriptions prior to admission  Medication Sig Dispense Refill  . acetaminophen (TYLENOL) 500 MG tablet Take 1,000 mg by mouth daily as needed (for headache).      [name] ibuprofen (ADVIL,MOTRIN) 800 MG tablet Take 1 tablet (800 mg total) by mouth 3 (three) times daily.  21 tablet  0  . Multiple Vitamin (MULTIVITAMIN) capsule Take 1 capsule by mouth daily. Women's one a day      . oxyCODONE-acetaminophen (PERCOCET) 5-325 MG per tablet Take 2 tablets by mouth every 4 (four) hours as needed for pain.  8 tablet  0    Review of Systems  Gastrointestinal: Positive for abdominal pain (pelvic cramping). Negative for nausea and vomiting.  Genitourinary: Negative  for dysuria, urgency, frequency and hematuria.  All other systems reviewed and are negative.   Physical Exam   Blood pressure 142/61, pulse 111, temperature 99.7 F (37.6 C), temperature source Oral, resp. rate 16, height 5\' 9"  (1.753 m), weight 87.544 kg (193 lb), last menstrual period 10/18/2012, SpO2 100.00%.  Physical Exam  Constitutional: She is oriented to person, place, and time. She appears well-developed and well-nourished. No distress.  HENT:  Head: Normocephalic.  Neck: Normal range of motion. Neck supple.  Cardiovascular: Normal rate, regular rhythm and normal heart sounds.  Exam reveals no gallop and no friction rub.   No murmur heard. Respiratory: Effort normal and breath sounds normal. No respiratory distress.  GI: She exhibits no mass. There is no tenderness. There is no rebound, no guarding and no CVA tenderness.  Genitourinary: Uterus is enlarged. Cervix exhibits no motion tenderness and no discharge. Vaginal discharge (white, creamy) found.  Musculoskeletal: Normal range of motion.  Neurological: She is alert and oriented to person, place, and time.  Skin: Skin is warm and dry.  Psychiatric: She has a normal mood and affect.    MAU Course  Procedures Results for orders placed during the hospital encounter of 11/17/12 (from the past 24 hour(s))  URINALYSIS, ROUTINE W REFLEX MICROSCOPIC  Status: Abnormal   Collection Time    11/17/12  8:40 PM      Result Value Range   Color, Urine YELLOW  YELLOW   APPearance HAZY (*) CLEAR   Specific Gravity, Urine >1.030 (*) 1.005 - 1.030   pH 5.5  5.0 - 8.0   Glucose, UA NEGATIVE  NEGATIVE mg/dL   Hgb urine dipstick TRACE (*) NEGATIVE   Bilirubin Urine NEGATIVE  NEGATIVE   Ketones, ur 15 (*) NEGATIVE mg/dL   Protein, ur NEGATIVE  NEGATIVE mg/dL   Urobilinogen, UA 0.2  0.0 - 1.0 mg/dL   Nitrite NEGATIVE  NEGATIVE   Leukocytes, UA NEGATIVE  NEGATIVE  URINE MICROSCOPIC-ADD ON     Status: Abnormal   Collection Time     11/17/12  8:40 PM      Result Value Range   Squamous Epithelial / LPF FEW (*) RARE   WBC, UA 0-2  <3 WBC/hpf   RBC / HPF 0-2  <3 RBC/hpf   Bacteria, UA RARE  RARE   Urine-Other MUCOUS PRESENT    POCT PREGNANCY, URINE     Status: Abnormal   Collection Time    11/17/12  8:43 PM      Result Value Range   Preg Test, Ur POSITIVE (*) NEGATIVE  WET PREP, GENITAL     Status: Abnormal   Collection Time    11/17/12  9:22 PM      Result Value Range   Yeast Wet Prep HPF POC NONE SEEN  NONE SEEN   Trich, Wet Prep NONE SEEN  NONE SEEN   Clue Cells Wet Prep HPF POC FEW (*) NONE SEEN   WBC, Wet Prep HPF POC RARE (*) NONE SEEN  ABO/RH     Status: None   Collection Time    11/17/12  9:41 PM      Result Value Range   ABO/RH(D) O POS    HCG, QUANTITATIVE, PREGNANCY     Status: Abnormal   Collection Time    11/17/12  9:41 PM      Result Value Range   hCG, Beta Chain, Quant, S 1044 (*) <5 mIU/mL  CBC     Status: Abnormal   Collection Time    11/17/12  9:41 PM      Result Value Range   WBC 6.1  4.0 - 10.5 K/uL   RBC 4.31  3.87 - 5.11 MIL/uL   Hemoglobin 10.7 (*) 12.0 - 15.0 g/dL   HCT 16.1 (*) 09.6 - 04.5 %   MCV 75.9 (*) 78.0 - 100.0 fL   MCH 24.8 (*) 26.0 - 34.0 pg   MCHC 32.7  30.0 - 36.0 g/dL   RDW 40.9  81.1 - 91.4 %   Platelets 217  150 - 400 K/uL   Results for orders placed during the hospital encounter of 11/17/12 (from the past 24 hour(s))  URINALYSIS, ROUTINE W REFLEX MICROSCOPIC     Status: Abnormal   Collection Time    11/17/12  8:40 PM      Result Value Range   Color, Urine YELLOW  YELLOW   APPearance HAZY (*) CLEAR   Specific Gravity, Urine >1.030 (*) 1.005 - 1.030   pH 5.5  5.0 - 8.0   Glucose, UA NEGATIVE  NEGATIVE mg/dL   Hgb urine dipstick TRACE (*) NEGATIVE   Bilirubin Urine NEGATIVE  NEGATIVE   Ketones, ur 15 (*) NEGATIVE mg/dL   Protein, ur NEGATIVE  NEGATIVE mg/dL   Urobilinogen, UA 0.2  0.0 - 1.0 mg/dL   Nitrite NEGATIVE  NEGATIVE   Leukocytes, UA NEGATIVE   NEGATIVE  URINE MICROSCOPIC-ADD ON     Status: Abnormal   Collection Time    11/17/12  8:40 PM      Result Value Range   Squamous Epithelial / LPF FEW (*) RARE   WBC, UA 0-2  <3 WBC/hpf   RBC / HPF 0-2  <3 RBC/hpf   Bacteria, UA RARE  RARE   Urine-Other MUCOUS PRESENT    POCT PREGNANCY, URINE     Status: Abnormal   Collection Time    11/17/12  8:43 PM      Result Value Range   Preg Test, Ur POSITIVE (*) NEGATIVE  WET PREP, GENITAL     Status: Abnormal   Collection Time    11/17/12  9:22 PM      Result Value Range   Yeast Wet Prep HPF POC NONE SEEN  NONE SEEN   Trich, Wet Prep NONE SEEN  NONE SEEN   Clue Cells Wet Prep HPF POC FEW (*) NONE SEEN   WBC, Wet Prep HPF POC RARE (*) NONE SEEN  ABO/RH     Status: None   Collection Time    11/17/12  9:41 PM      Result Value Range   ABO/RH(D) O POS    HCG, QUANTITATIVE, PREGNANCY     Status: Abnormal   Collection Time    11/17/12  9:41 PM      Result Value Range   hCG, Beta Chain, Quant, S 1044 (*) <5 mIU/mL  CBC     Status: Abnormal   Collection Time    11/17/12  9:41 PM      Result Value Range   WBC 6.1  4.0 - 10.5 K/uL   RBC 4.31  3.87 - 5.11 MIL/uL   Hemoglobin 10.7 (*) 12.0 - 15.0 g/dL   HCT 98.1 (*) 19.1 - 47.8 %   MCV 75.9 (*) 78.0 - 100.0 fL   MCH 24.8 (*) 26.0 - 34.0 pg   MCHC 32.7  30.0 - 36.0 g/dL   RDW 29.5  62.1 - 30.8 %   Platelets 217  150 - 400 K/uL   Transabdominal ultrasound was performed for evaluation of the  gestation as well as the maternal uterus and adnexal regions.  COMPARISON: None.  FINDINGS:  Intrauterine gestational sac: Visualized/normal in shape.  Yolk sac: Absent  Embryo: Absent  MSD: 2.7 mm 4 w 5 d  Maternal uterus/adnexae: No subchorionic hemorrhage. No adnexal  masses. Ovaries symmetric in size. Small amount of free fluid.  IMPRESSION:  Early intrauterine pregnancy, 4 weeks 5 days by mean sac diameter.  Assessment and Plan  Abdominal Pain in Early Pregnancy - 4.5 wks  IUP Bacterial Vaginosis  Plan: Discharge to home RX Flagyl Repeat BHCG in 48 hours and [place] in 10 days Ectopic precautions.    [facility] 11/17/2012, 9:00 PM

## 2012-11-18 NOTE — MAU Provider Note (Signed)
Attestation of Attending Supervision of Advanced Practitioner (PA/CNM/NP): Evaluation and management procedures were performed by the Advanced Practitioner under my supervision and collaboration.  I have reviewed the Advanced Practitioner's note and chart, and I agree with the management and plan.  Evan Mackie, MD, FACOG Attending Obstetrician & Gynecologist Faculty Practice, [facility]  

## 2012-11-19 ENCOUNTER — Inpatient Hospital Stay (HOSPITAL_COMMUNITY)
Admission: AD | Admit: 2012-11-19 | Discharge: 2012-11-19 | Disposition: A | Discharge status: Home / Self Care | Payer: Medicaid Other | Source: Ambulatory Visit | Attending: Obstetrics and Gynecology | Admitting: Obstetrics and Gynecology

## 2012-11-19 DIAGNOSIS — O0281 Inappropriate change in quantitative human chorionic gonadotropin (hCG) in early pregnancy: Secondary | ICD-10-CM

## 2012-11-19 DIAGNOSIS — O99891 Other specified diseases and conditions complicating pregnancy: Secondary | ICD-10-CM

## 2012-11-19 LAB — HCG, QUANTITATIVE, PREGNANCY: hCG, Beta Chain, Quant, S: 1898 m[IU]/mL — ABNORMAL HIGH (ref <5)

## 2012-11-19 NOTE — MAU Provider Note (Signed)
  HPI:  Ms. Veronica Hines is a 22 y.o. female; G1P0 at [redacted]w[redacted]d who presents to MAU for a follow up beta Hcg. Quant 9/13: 1044 Quant 9/15: 1898. She denies pain or bleeding at this time.    Objective:   GENERAL: Well-developed, well-nourished female in no acute distress.  HEENT: Normocephalic, atraumatic.   LUNGS: Effort normal HEART: Regular rate  SKIN: Warm, dry and without erythema PSYCH: Normal mood and affect  Filed Vitals:   11/19/12 1014  BP: 127/61  Pulse: 90  Temp: 99 F (37.2 C)  TempSrc: Oral  Resp: 16  SpO2: 100%    Results for orders placed during the hospital encounter of 11/19/12 (from the past 24 hour(s))  HCG, QUANTITATIVE, PREGNANCY     Status: Abnormal   Collection Time    11/19/12  9:58 AM      Result Value Range   hCG, Beta Chain, Quant, S 1898 (*) <5 mIU/mL   US Ob Comp Less 14 Wks  11/17/2012   CLINICAL DATA:  Could cramping.  EXAM: OBSTETRIC <14 WK ULTRASOUND  TECHNIQUE: Transabdominal ultrasound was performed for evaluation of the gestation as well as the maternal uterus and adnexal regions.  COMPARISON:  None.  FINDINGS: Intrauterine gestational sac: Visualized/normal in shape.  Yolk sac:  Absent  Embryo:  Absent  MSD: 2.7  mm   4 w   5  d  Maternal uterus/adnexae: No subchorionic hemorrhage. No adnexal masses. Ovaries symmetric in size. Small amount of free fluid.  IMPRESSION: Early intrauterine pregnancy, 4 weeks 5 days by mean sac diameter.   Electronically Signed   By: Charlett Nose M.D.   On: 11/17/2012 22:39   US Ob Transvaginal  11/17/2012   CLINICAL DATA:  Could cramping.  EXAM: OBSTETRIC <14 WK ULTRASOUND  TECHNIQUE: Transabdominal ultrasound was performed for evaluation of the gestation as well as the maternal uterus and adnexal regions.  COMPARISON:  None.  FINDINGS: Intrauterine gestational sac: Visualized/normal in shape.  Yolk sac:  Absent  Embryo:  Absent  MSD: 2.7  mm   4 w   5  d  Maternal uterus/adnexae: No subchorionic hemorrhage. No  adnexal masses. Ovaries symmetric in size. Small amount of free fluid.  IMPRESSION: Early intrauterine pregnancy, 4 weeks 5 days by mean sac diameter.   Electronically Signed   By: Charlett Nose M.D.   On: 11/17/2012 22:39    Constulted with Dr. Jolayne Panther regarding plan of care; will plan to bring patient back in 7 days for repeat US.   A: 1. Elevated level of quantitative human chorionic gonadotropin (hCG) for gestational age in early pregnancy     P: Discharge home  Pt has follow up US for viability scheduled for 9/23 at 1445 Return to MAU if symptoms develop: pain or bleeding Ectopic precautions discussed Take prenatal vitamin everyday with food  Support given  Iona Hansen Rasch, NP 11/19/2012 11:36 AM

## 2012-11-19 NOTE — MAU Note (Signed)
Patient to MAU for repeat BHCG. Patient denies pain or bleeding. 

## 2012-11-20 ENCOUNTER — Encounter (HOSPITAL_COMMUNITY): Payer: Self-pay | Admitting: *Deleted

## 2012-11-20 NOTE — MAU Provider Note (Signed)
Attestation of Attending Supervision of Advanced Practitioner (CNM/NP): Evaluation and management procedures were performed by the Advanced Practitioner under my supervision and collaboration.  I have reviewed the Advanced Practitioner's note and chart, and I agree with the management and plan.  Jenaya Saar 11/20/2012 7:27 AM   

## 2012-11-27 ENCOUNTER — Ambulatory Visit (HOSPITAL_COMMUNITY)
Admission: RE | Admit: 2012-11-27 | Discharge: 2012-11-27 | Disposition: A | Discharge status: Home / Self Care | Payer: Medicaid Other | Source: Ambulatory Visit | Attending: Family | Admitting: Family

## 2012-11-27 DIAGNOSIS — O99891 Other specified diseases and conditions complicating pregnancy: Secondary | ICD-10-CM | POA: Insufficient documentation

## 2012-11-27 DIAGNOSIS — O26899 Other specified pregnancy related conditions, unspecified trimester: Secondary | ICD-10-CM

## 2012-11-27 DIAGNOSIS — O3680X0 Pregnancy with inconclusive fetal viability, not applicable or unspecified: Secondary | ICD-10-CM | POA: Insufficient documentation

## 2012-11-27 DIAGNOSIS — R109 Unspecified abdominal pain: Secondary | ICD-10-CM | POA: Insufficient documentation

## 2012-12-05 ENCOUNTER — Encounter (HOSPITAL_COMMUNITY): Payer: Self-pay | Admitting: *Deleted

## 2012-12-05 ENCOUNTER — Emergency Department (HOSPITAL_COMMUNITY)
Admission: EM | Admit: 2012-12-05 | Discharge: 2012-12-05 | Disposition: A | Discharge status: Home / Self Care | Payer: Medicaid Other | Source: Home / Self Care | Attending: Family Medicine | Admitting: Family Medicine

## 2012-12-05 DIAGNOSIS — O219 Vomiting of pregnancy, unspecified: Secondary | ICD-10-CM

## 2012-12-05 MED ORDER — ONDANSETRON HCL 4 MG PO TABS: 4.0000 mg | ORAL_TABLET | Freq: Three times a day (TID) | ORAL | Status: DC | PRN

## 2012-12-05 MED ORDER — ONDANSETRON 4 MG PO TBDP
4.0000 mg | ORAL_TABLET | Freq: Once | ORAL | Status: AC
  Administered 2012-12-05: 4 mg via ORAL

## 2012-12-05 MED ORDER — ONDANSETRON 4 MG PO TBDP
ORAL_TABLET | ORAL | Status: AC
  Filled 2012-12-05: qty 1

## 2012-12-05 NOTE — ED Provider Notes (Signed)
CSN: [id number]     Arrival date & time 12/05/12  1446 History   First MD Initiated Contact with Patient 12/05/12 [phone]     Chief Complaint  Patient presents with  . Emesis   (Consider location/radiation/quality/duration/timing/severity/associated sxs/prior Treatment) Patient is a 22 y.o. female presenting with vomiting. The history is provided by the patient.  Emesis Severity:  Mild Duration:  2 weeks Timing:  Intermittent Progression:  Unchanged Chronicity:  New Context comment:  6wk preg with first preg, no fever, no uti or diarrhea sx. Associated symptoms: no abdominal pain, no diarrhea and no fever   Risk factors: pregnant now   Risk factors: no sick contacts and no suspect food intake     Past Medical History  Diagnosis Date  . Ovarian cyst   . Migraine   . Chlamydia    History reviewed. No pertinent past surgical history. Family History  Problem Relation Age of Onset  . Diabetes Maternal Grandmother   . Cancer Maternal Grandfather     lung  . Cancer Mother     Lung Cancer - smoker  . Depression Mother   . Hypertension Mother   . Anxiety disorder Mother   . Bipolar disorder Mother   . Bipolar disorder Father   . Hypertension Maternal Aunt   . Hypertension Maternal Uncle   . Diabetes Maternal Uncle   . Hypertension Paternal Aunt   . Diabetes Paternal Aunt   . Hypertension Paternal Uncle   . Diabetes Paternal Uncle    History  Substance Use Topics  . Smoking status: Former Games developer  . Smokeless tobacco: Never Used  . Alcohol Use: No   OB History   Grav Para Term Preterm Abortions TAB SAB Ect Mult Living   1              Review of Systems  Gastrointestinal: Positive for nausea. Negative for vomiting, abdominal pain and diarrhea.  Genitourinary: Negative for vaginal bleeding, vaginal discharge and vaginal pain.    Allergies  Review of patient's allergies indicates no known allergies.  Home Medications   Current Outpatient Rx  Name  Route  Sig   Dispense  Refill  . acetaminophen (TYLENOL) 500 MG tablet   Oral   Take 1,000 mg by mouth daily as needed (for headache).         . metroNIDAZOLE (FLAGYL) 500 MG tablet   Oral   Take 1 tablet (500 mg total) by mouth 2 (two) times daily.   14 tablet   0   . Multiple Vitamin (MULTIVITAMIN) capsule   Oral   Take 1 capsule by mouth daily. Women's one a day         . ondansetron (ZOFRAN) 4 MG tablet   Oral   Take 1 tablet (4 mg total) by mouth every 8 (eight) hours as needed for nausea.   12 tablet   0    BP 116/78  Pulse 108  Temp(Src) 98.5 F (36.9 C) (Oral)  Resp 16  SpO2 100%  LMP 10/18/2012 Physical Exam  Nursing note and vitals reviewed. Constitutional: She is oriented to person, place, and time. She appears well-developed and well-nourished. No distress.  HENT:  Mouth/Throat: Oropharynx is clear and moist.  Neck: Normal range of motion. Neck supple.  Abdominal: Soft. Bowel sounds are normal. She exhibits no distension and no mass. There is no tenderness. There is no rebound and no guarding.  Neurological: She is alert and oriented to person, place, and time.  Skin:  Skin is warm and dry.    ED Course  Procedures (including critical care time) Labs Review Labs Reviewed - No data to display Imaging Review No results found.  MDM      Linna Hoff, MD 12/05/12 1630

## 2012-12-05 NOTE — ED Notes (Signed)
Pt  Is    [redacted]  Week  Pregnant   -  C/o  Vomiting   And  Headache   denys  Any  abd  Pain  /  Bleeding or  Other  Related   Symptoms    -  Sitting  Upright on  Exam table    Speaking in  Complete  sentances   And  Is  In no  Acute  Distress

## 2012-12-10 ENCOUNTER — Other Ambulatory Visit: Payer: Medicaid Other

## 2012-12-10 DIAGNOSIS — Z331 Pregnant state, incidental: Secondary | ICD-10-CM

## 2012-12-10 NOTE — Progress Notes (Signed)
OB LABS DONE TODAY Veronica Hines 

## 2012-12-11 LAB — OBSTETRIC PANEL
Antibody Screen: NEGATIVE
Basophils Absolute: 0 10*3/uL (ref 0.0–0.1)
Basophils Relative: 0 % (ref 0–1)
Eosinophils Absolute: 0 10*3/uL (ref 0.0–0.7)
Eosinophils Relative: 1 % (ref 0–5)
HCT: 35.9 % — ABNORMAL LOW (ref 36.0–46.0)
Hemoglobin: 12.1 g/dL (ref 12.0–15.0)
Hepatitis B Surface Ag: NEGATIVE
Lymphocytes Relative: 23 % (ref 12–46)
Lymphs Abs: 1.3 10*3/uL (ref 0.7–4.0)
MCH: 25.1 pg — ABNORMAL LOW (ref 26.0–34.0)
MCHC: 33.7 g/dL (ref 30.0–36.0)
MCV: 74.3 fL — ABNORMAL LOW (ref 78.0–100.0)
Monocytes Absolute: 0.6 10*3/uL (ref 0.1–1.0)
Monocytes Relative: 10 % (ref 3–12)
Neutro Abs: 3.7 10*3/uL (ref 1.7–7.7)
Neutrophils Relative %: 66 % (ref 43–77)
Platelets: 266 10*3/uL (ref 150–400)
RBC: 4.83 MIL/uL (ref 3.87–5.11)
RDW: 14.7 % (ref 11.5–15.5)
Rh Type: POSITIVE
Rubella: 3.82 Index — ABNORMAL HIGH (ref <0.90)
WBC: 5.6 10*3/uL (ref 4.0–10.5)

## 2012-12-11 LAB — CULTURE, OB URINE: Organism ID, Bacteria: NO GROWTH

## 2012-12-23 ENCOUNTER — Inpatient Hospital Stay (HOSPITAL_COMMUNITY): Payer: Medicaid Other

## 2012-12-23 ENCOUNTER — Encounter (HOSPITAL_COMMUNITY): Payer: Self-pay | Admitting: *Deleted

## 2012-12-23 ENCOUNTER — Inpatient Hospital Stay (HOSPITAL_COMMUNITY)
Admission: AD | Admit: 2012-12-23 | Discharge: 2012-12-23 | Disposition: A | Discharge status: Home / Self Care | Payer: Medicaid Other | Source: Ambulatory Visit | Attending: Obstetrics and Gynecology | Admitting: Obstetrics and Gynecology

## 2012-12-23 DIAGNOSIS — N760 Acute vaginitis: Secondary | ICD-10-CM | POA: Insufficient documentation

## 2012-12-23 DIAGNOSIS — R109 Unspecified abdominal pain: Secondary | ICD-10-CM

## 2012-12-23 DIAGNOSIS — B9689 Other specified bacterial agents as the cause of diseases classified elsewhere: Secondary | ICD-10-CM | POA: Insufficient documentation

## 2012-12-23 DIAGNOSIS — R1031 Right lower quadrant pain: Secondary | ICD-10-CM | POA: Insufficient documentation

## 2012-12-23 DIAGNOSIS — O2390 Unspecified genitourinary tract infection in pregnancy, unspecified trimester: Secondary | ICD-10-CM | POA: Insufficient documentation

## 2012-12-23 DIAGNOSIS — A499 Bacterial infection, unspecified: Secondary | ICD-10-CM | POA: Insufficient documentation

## 2012-12-23 DIAGNOSIS — O26899 Other specified pregnancy related conditions, unspecified trimester: Secondary | ICD-10-CM

## 2012-12-23 LAB — CBC
HCT: 32.9 % — ABNORMAL LOW (ref 36.0–46.0)
Hemoglobin: 10.8 g/dL — ABNORMAL LOW (ref 12.0–15.0)
RBC: 4.37 MIL/uL (ref 3.87–5.11)
WBC: 4.6 10*3/uL (ref 4.0–10.5)

## 2012-12-23 LAB — WET PREP, GENITAL
Trich, Wet Prep: NONE SEEN
Yeast Wet Prep HPF POC: NONE SEEN

## 2012-12-23 LAB — URINALYSIS, ROUTINE W REFLEX MICROSCOPIC
Glucose, UA: NEGATIVE mg/dL
Leukocytes, UA: NEGATIVE
Nitrite: NEGATIVE
Specific Gravity, Urine: 1.02 (ref 1.005–1.030)
pH: 7 (ref 5.0–8.0)

## 2012-12-23 LAB — HCG, QUANTITATIVE, PREGNANCY: hCG, Beta Chain, Quant, S: 83692 m[IU]/mL — ABNORMAL HIGH (ref <5)

## 2012-12-23 MED ORDER — METRONIDAZOLE 500 MG PO TABS: 500.0000 mg | ORAL_TABLET | Freq: Two times a day (BID) | ORAL | Status: DC

## 2012-12-23 NOTE — MAU Provider Note (Signed)
History     CSN: [id number]  Arrival date and time: 12/23/12 8413   First Provider Initiated Contact with Patient 12/23/12 1042      Chief Complaint  Patient presents with  . Abdominal Pain   HPI  Pt is 22 yo G1P0 with LMP [redacted]w[redacted]d pregnant.  Pt found out she was pregnant last month.  Pt plans to go to [facility] with appointment 10/21.   Pt had an episode of right lower quadrant pain- sharp stabbing that lasted about 2 or 3 minutes and then went away.  Pt is not having the pain at this time- pt had the pain yesterday afternoon and earlier while pt was waiting to be seen.  Pt denies pain with urination, constipation or diarrhea. Pt denies spotting or bleeding.  Past Medical History  Diagnosis Date  . Ovarian cyst   . Migraine   . Chlamydia     History reviewed. No pertinent past surgical history.  Family History  Problem Relation Age of Onset  . Diabetes Maternal Grandmother   . Cancer Maternal Grandfather     lung  . Depression Mother   . Hypertension Mother   . Anxiety disorder Mother   . Bipolar disorder Mother   . Bipolar disorder Father   . Hypertension Maternal Aunt   . Hypertension Maternal Uncle   . Diabetes Maternal Uncle   . Hypertension Paternal Aunt   . Diabetes Paternal Aunt   . Hypertension Paternal Uncle   . Diabetes Paternal Uncle     History  Substance Use Topics  . Smoking status: Former Smoker    Types: Cigarettes, Cigars  . Smokeless tobacco: Never Used  . Alcohol Use: No    Allergies: No Known Allergies  Prescriptions prior to admission  Medication Sig Dispense Refill  . acetaminophen (TYLENOL) 500 MG tablet Take 1,000 mg by mouth daily as needed (for headache).      . Prenatal Vit-Fe Fumarate-FA (PRENATAL MULTIVITAMIN) TABS tablet Take 1 tablet by mouth daily at 12 noon.        Review of Systems  Constitutional: Negative for fever and chills.  Gastrointestinal: Positive for abdominal pain. Negative for nausea,  diarrhea and constipation.  Genitourinary: Negative for dysuria and urgency.   Physical Exam   Blood pressure 126/69, pulse 80, temperature 98.3 F (36.8 C), temperature source Oral, resp. rate 16, height 5\' 9"  (1.753 m), weight 87.544 kg (193 lb), last menstrual period 10/18/2012.  Physical Exam  Nursing note and vitals reviewed. Constitutional: She is oriented to person, place, and time. She appears well-developed and well-nourished. No distress.  HENT:  Head: Normocephalic.  Eyes: Pupils are equal, round, and reactive to light.  Neck: Normal range of motion. Neck supple.  Cardiovascular: Normal rate.   Respiratory: Effort normal.  GI: Soft. She exhibits no distension. There is no tenderness. There is no rebound.  Genitourinary:  Mod amount of frothy white cream colored d/c in vault; cervix clean, closed NT; uterus enlarged, gravid 8-10 week size ant; adnexa without palpable enlargement or tenderness  Musculoskeletal: Normal range of motion.  Neurological: She is alert and oriented to person, place, and time.  Skin: Skin is warm and dry.  Psychiatric: She has a normal mood and affect.    MAU Course  Procedures Results for orders placed during the hospital encounter of 12/23/12 (from the past 24 hour(s))  URINALYSIS, ROUTINE W REFLEX MICROSCOPIC     Status: None   Collection Time    12/23/12  10:08 AM      Result Value Range   Color, Urine YELLOW  YELLOW   APPearance CLEAR  CLEAR   Specific Gravity, Urine 1.020  1.005 - 1.030   pH 7.0  5.0 - 8.0   Glucose, UA NEGATIVE  NEGATIVE mg/dL   Hgb urine dipstick NEGATIVE  NEGATIVE   Bilirubin Urine NEGATIVE  NEGATIVE   Ketones, ur NEGATIVE  NEGATIVE mg/dL   Protein, ur NEGATIVE  NEGATIVE mg/dL   Urobilinogen, UA 0.2  0.0 - 1.0 mg/dL   Nitrite NEGATIVE  NEGATIVE   Leukocytes, UA NEGATIVE  NEGATIVE  CBC     Status: Abnormal   Collection Time    12/23/12 10:53 AM      Result Value Range   WBC 4.6  4.0 - 10.5 K/uL   RBC 4.37   3.87 - 5.11 MIL/uL   Hemoglobin 10.8 (*) 12.0 - 15.0 g/dL   HCT 16.1 (*) 09.6 - 04.5 %   MCV 75.3 (*) 78.0 - 100.0 fL   MCH 24.7 (*) 26.0 - 34.0 pg   MCHC 32.8  30.0 - 36.0 g/dL   RDW 40.9  81.1 - 91.4 %   Platelets 203  150 - 400 K/uL  HCG, QUANTITATIVE, PREGNANCY     Status: Abnormal   Collection Time    12/23/12 10:53 AM      Result Value Range   hCG, Beta Chain, Quant, S 78295 (*) <5 mIU/mL  WET PREP, GENITAL     Status: Abnormal   Collection Time    12/23/12 11:13 AM      Result Value Range   Yeast Wet Prep HPF POC NONE SEEN  NONE SEEN   Trich, Wet Prep NONE SEEN  NONE SEEN   Clue Cells Wet Prep HPF POC MANY (*) NONE SEEN   WBC, Wet Prep HPF POC FEW (*) NONE SEEN  US Ob Transvaginal  12/23/2012   CLINICAL DATA:  Abdominal/pelvic cramping. Pregnant.  EXAM: TRANSVAGINAL OB ULTRASOUND  TECHNIQUE: Transvaginal ultrasound was performed for complete evaluation of the gestation as well as the maternal uterus, adnexal regions, and pelvic cul-de-sac.  COMPARISON:  11/27/2012  FINDINGS: Intrauterine gestational sac: Visualized/normal in shape.  Yolk sac:  Yes  Embryo:  Well-formed embryo seen  Cardiac Activity: Visualized  Heart Rate: 154 bpm  MSD:   mm    w     d  CRL:   28.4  mm   9 w 5 d                  [place] EDC: 07/25/2013  Maternal uterus/adnexae: Minimal subchorionic hemorrhage is evident along the margin of the gestational sac.  Normal ovaries. No adnexal masses. No free fluid.  IMPRESSION: 1. Single live intrauterine pregnancy. There has been normal expected growth since the prior ultrasound. 2. Minimal subchorionic hemorrhage is evident. No other pregnancy complication. 3. Normal ovaries and adnexa.   Electronically Signed   By: Amie Portland M.D.   On: 12/23/2012 12:06  GC/Chlamydia pending  Assessment and Plan  Abdominal pain in pregnancy Bacterial vaginitis- Flagyl 500 mg BID for 7 days F/u with OB care  Veronica Hines 12/23/2012, 10:42 AM

## 2012-12-23 NOTE — MAU Note (Signed)
Patient presents with complaint of sharp right sided pain.

## 2012-12-24 NOTE — MAU Provider Note (Signed)
Attestation of Attending Supervision of Advanced Practitioner: Evaluation and management procedures were performed by the PA/NP/CNM/OB Fellow under my supervision/collaboration. Chart reviewed and agree with management and plan.  Malaiah Viramontes V 12/24/2012 10:06 AM

## 2012-12-25 ENCOUNTER — Encounter: Payer: Self-pay | Admitting: Family Medicine

## 2012-12-25 ENCOUNTER — Ambulatory Visit (INDEPENDENT_AMBULATORY_CARE_PROVIDER_SITE_OTHER): Payer: Medicaid Other | Admitting: Family Medicine

## 2012-12-25 VITALS — BP 110/68 | Wt 185.0 lb

## 2012-12-25 DIAGNOSIS — Z3401 Encounter for supervision of normal first pregnancy, first trimester: Secondary | ICD-10-CM

## 2012-12-25 DIAGNOSIS — Z3400 Encounter for supervision of normal first pregnancy, unspecified trimester: Secondary | ICD-10-CM

## 2012-12-25 DIAGNOSIS — D649 Anemia, unspecified: Secondary | ICD-10-CM | POA: Insufficient documentation

## 2012-12-25 HISTORY — DX: Encounter for supervision of normal first pregnancy, first trimester: Z34.01

## 2012-12-25 MED ORDER — IRON 325 (65 FE) MG PO TABS: 325.0000 mg | ORAL_TABLET | Freq: Every day | ORAL | Status: DC

## 2012-12-25 NOTE — Progress Notes (Signed)
No complaints today. First pregnancy. Unplanned but happy to be pregnant. Father involved.  Denies vag dc/bleeding, fevers. Occasional nausea.  No substance abuse  PE:  Gen: WNWD HEENT: MMM, EOMI CV: RRR, no m/r/g Res: CTAB, nml effort Ext: 2+ pulses, no edema  A/P: 22 yo G1 at 9.5 by LMP w/ single IUP.  - Integrated screen after 11wks - scheduled - continue PNV - start iron supplement 325 - Return in 4 wks

## 2012-12-25 NOTE — Assessment & Plan Note (Signed)
Doing well. On PNV Return in 4 wks

## 2012-12-25 NOTE — Patient Instructions (Signed)
You and the baby are doing great Please start taking a prenatal vitamin with iron  Or a separate iron tablet (325mg ). Take this daily with breakfast Please come back to our clinic in 4 weeks for your next appointment Please call 24/7 with any questions or go to the MAU.  Have a wonderful day  Pregnancy - First Trimester During sexual intercourse, millions of sperm go into the vagina. Only 1 sperm will penetrate and fertilize the female egg while it is in the Fallopian tube. One week later, the fertilized egg implants into the wall of the uterus. An embryo begins to develop into a baby. At 6 to 8 weeks, the eyes and face are formed and the heartbeat can be seen on ultrasound. At the end of 12 weeks (first trimester), all the baby's organs are formed. Now that you are pregnant, you will want to do everything you can to have a healthy baby. Two of the most important things are to get good prenatal care and follow your caregiver's instructions. Prenatal care is all the medical care you receive before the baby's birth. It is given to prevent, find, and treat problems during the pregnancy and childbirth. PRENATAL EXAMS  During prenatal visits, your weight, blood pressure, and urine are checked. This is done to make sure you are healthy and progressing normally during the pregnancy.  A pregnant woman should gain 25 to 35 pounds during the pregnancy. However, if you are overweight or underweight, your caregiver will advise you regarding your weight.  Your caregiver will ask and answer questions for you.  Blood work, cervical cultures, other necessary tests, and a Pap test are done during your prenatal exams. These tests are done to check on your health and the probable health of your baby. Tests are strongly recommended and done for HIV with your permission. This is the virus that causes AIDS. These tests are done because medicines can be given to help prevent your baby from being born with this infection  should you have been infected without knowing it. Blood work is also used to find out your blood type, previous infections, and follow your blood levels (hemoglobin).  Low hemoglobin (anemia) is common during pregnancy. Iron and vitamins are given to help prevent this. Later in the pregnancy, blood tests for diabetes will be done along with any other tests if any problems develop.  You may need other tests to make sure you and the baby are doing well. CHANGES DURING THE FIRST TRIMESTER  Your body goes through many changes during pregnancy. They vary from person to person. Talk to your caregiver about changes you notice and are concerned about. Changes can include:  Your menstrual period stops.  The egg and sperm carry the genes that determine what you look like. Genes from you and your partner are forming a baby. The female genes determine whether the baby is a boy or a girl.  Your body increases in girth and you may feel bloated.  Feeling sick to your stomach (nauseous) and throwing up (vomiting). If the vomiting is uncontrollable, call your caregiver.  Your breasts will begin to enlarge and become tender.  Your nipples may stick out more and become darker.  The need to urinate more. Painful urination may mean you have a bladder infection.  Tiring easily.  Loss of appetite.  Cravings for certain kinds of food.  At first, you may gain or lose a couple of pounds.  You may have changes in your emotions  from day to day (excited to be pregnant or concerned something may go wrong with the pregnancy and baby).  You may have more vivid and strange dreams. HOME CARE INSTRUCTIONS   It is very important to avoid all smoking, alcohol and non-prescribed drugs during your pregnancy. These affect the formation and growth of the baby. Avoid chemicals while pregnant to ensure the delivery of a healthy infant.  Start your prenatal visits by the 12th week of pregnancy. They are usually scheduled  monthly at first, then more often in the last 2 months before delivery. Keep your caregiver's appointments. Follow your caregiver's instructions regarding medicine use, blood and lab tests, exercise, and diet.  During pregnancy, you are providing food for you and your baby. Eat regular, well-balanced meals. Choose foods such as meat, fish, milk and other low fat dairy products, vegetables, fruits, and whole-grain breads and cereals. Your caregiver will tell you of the ideal weight gain.  You can help morning sickness by keeping soda crackers at the bedside. Eat a couple before arising in the morning. You may want to use the crackers without salt on them.  Eating 4 to 5 small meals rather than 3 large meals a day also may help the nausea and vomiting.  Drinking liquids between meals instead of during meals also seems to help nausea and vomiting.  A physical sexual relationship may be continued throughout pregnancy if there are no other problems. Problems may be early (premature) leaking of amniotic fluid from the membranes, vaginal bleeding, or belly (abdominal) pain.  Exercise regularly if there are no restrictions. Check with your caregiver or physical therapist if you are unsure of the safety of some of your exercises. Greater weight gain will occur in the last 2 trimesters of pregnancy. Exercising will help:  Control your weight.  Keep you in shape.  Prepare you for labor and delivery.  Help you lose your pregnancy weight after you deliver your baby.  Wear a good support or jogging bra for breast tenderness during pregnancy. This may help if worn during sleep too.  Ask when prenatal classes are available. Begin classes when they are offered.  Do not use hot tubs, steam rooms, or saunas.  Wear your seat belt when driving. This protects you and your baby if you are in an accident.  Avoid raw meat, uncooked cheese, cat litter boxes, and soil used by cats throughout the pregnancy. These  carry germs that can cause birth defects in the baby.  The first trimester is a good time to visit your dentist for your dental health. Getting your teeth cleaned is okay. Use a softer toothbrush and brush gently during pregnancy.  Ask for help if you have financial, counseling, or nutritional needs during pregnancy. Your caregiver will be able to offer counseling for these needs as well as refer you for other special needs.  Do not take any medicines or herbs unless told by your caregiver.  Inform your caregiver if there is any mental or physical domestic violence.  Make a list of emergency phone numbers of family, friends, hospital, and police and fire departments.  Write down your questions. Take them to your prenatal visit.  Do not douche.  Do not cross your legs.  If you have to stand for long periods of time, rotate you feet or take small steps in a circle.  You may have more vaginal secretions that may require a sanitary pad. Do not use tampons or scented sanitary pads. MEDICINES AND  DRUG USE IN PREGNANCY  Take prenatal vitamins as directed. The vitamin should contain 1 milligram of folic acid. Keep all vitamins out of reach of children. Only a couple vitamins or tablets containing iron may be fatal to a baby or young child when ingested.  Avoid use of all medicines, including herbs, over-the-counter medicines, not prescribed or suggested by your caregiver. Only take over-the-counter or prescription medicines for pain, discomfort, or fever as directed by your caregiver. Do not use aspirin, ibuprofen, or naproxen unless directed by your caregiver.  Let your caregiver also know about herbs you may be using.  Alcohol is related to a number of birth defects. This includes fetal alcohol syndrome. All alcohol, in any form, should be avoided completely. Smoking will cause low birth rate and premature babies.  Street or illegal drugs are very harmful to the baby. They are absolutely  forbidden. A baby born to an addicted mother will be addicted at birth. The baby will go through the same withdrawal an adult does.  Let your caregiver know about any medicines that you have to take and for what reason you take them. SEEK MEDICAL CARE IF:  You have any concerns or worries during your pregnancy. It is better to call with your questions if you feel they cannot wait, rather than worry about them. SEEK IMMEDIATE MEDICAL CARE IF:   An unexplained oral temperature above 102 F (38.9 C) develops, or as your caregiver suggests.  You have leaking of fluid from the vagina (birth canal). If leaking membranes are suspected, take your temperature and inform your caregiver of this when you call.  There is vaginal spotting or bleeding. Notify your caregiver of the amount and how many pads are used.  You develop a bad smelling vaginal discharge with a change in the color.  You continue to feel sick to your stomach (nauseated) and have no relief from remedies suggested. You vomit blood or coffee ground-like materials.  You lose more than 2 pounds of weight in 1 week.  You gain more than 2 pounds of weight in 1 week and you notice swelling of your face, hands, feet, or legs.  You gain 5 pounds or more in 1 week (even if you do not have swelling of your hands, face, legs, or feet).  You get exposed to [place] measles and have never had them.  You are exposed to fifth disease or chickenpox.  You develop belly (abdominal) pain. Round ligament discomfort is a common non-cancerous (benign) cause of abdominal pain in pregnancy. Your caregiver still must evaluate this.  You develop headache, fever, diarrhea, pain with urination, or shortness of breath.  You fall or are in a car accident or have any kind of trauma.  There is mental or physical violence in your home. Document Released: 02/15/2001 Document Revised: 11/16/2011 Document Reviewed: 08/19/2008 [facility] Patient Information 2014  [place], [place].

## 2012-12-25 NOTE — Assessment & Plan Note (Signed)
Anemia noted. Microcytic Starting Iron 325

## 2013-01-17 ENCOUNTER — Ambulatory Visit (HOSPITAL_COMMUNITY)
Admission: RE | Admit: 2013-01-17 | Discharge: 2013-01-17 | Disposition: A | Discharge status: Home / Self Care | Payer: Medicaid Other | Source: Ambulatory Visit | Attending: Family Medicine | Admitting: Family Medicine

## 2013-01-17 ENCOUNTER — Other Ambulatory Visit: Payer: Self-pay

## 2013-01-17 DIAGNOSIS — O351XX0 Maternal care for (suspected) chromosomal abnormality in fetus, not applicable or unspecified: Secondary | ICD-10-CM | POA: Insufficient documentation

## 2013-01-17 DIAGNOSIS — Z3401 Encounter for supervision of normal first pregnancy, first trimester: Secondary | ICD-10-CM

## 2013-01-17 DIAGNOSIS — Z3689 Encounter for other specified antenatal screening: Secondary | ICD-10-CM | POA: Insufficient documentation

## 2013-01-17 DIAGNOSIS — O3510X0 Maternal care for (suspected) chromosomal abnormality in fetus, unspecified, not applicable or unspecified: Secondary | ICD-10-CM | POA: Insufficient documentation

## 2013-01-17 LAB — OB RESULTS CONSOLE GC/CHLAMYDIA
CHLAMYDIA, DNA PROBE: NEGATIVE
Gonorrhea: NEGATIVE

## 2013-01-22 ENCOUNTER — Encounter: Payer: Medicaid Other | Admitting: Family Medicine

## 2013-01-23 ENCOUNTER — Encounter: Payer: Medicaid Other | Admitting: Family Medicine

## 2013-01-29 ENCOUNTER — Ambulatory Visit (INDEPENDENT_AMBULATORY_CARE_PROVIDER_SITE_OTHER): Payer: Medicaid Other | Admitting: Family Medicine

## 2013-01-29 VITALS — BP 121/75 | Temp 98.7°F | Wt 190.0 lb

## 2013-01-29 DIAGNOSIS — N898 Other specified noninflammatory disorders of vagina: Secondary | ICD-10-CM

## 2013-01-29 DIAGNOSIS — A5901 Trichomonal vulvovaginitis: Secondary | ICD-10-CM

## 2013-01-29 DIAGNOSIS — Z23 Encounter for immunization: Secondary | ICD-10-CM

## 2013-01-29 HISTORY — DX: Trichomonal vulvovaginitis: A59.01

## 2013-01-29 LAB — POCT WET PREP (WET MOUNT): Clue Cells Wet Prep Whiff POC: POSITIVE

## 2013-01-29 MED ORDER — METRONIDAZOLE 500 MG PO TABS: 500.0000 mg | ORAL_TABLET | Freq: Two times a day (BID) | ORAL | Status: DC

## 2013-01-29 NOTE — Patient Instructions (Signed)
We have sent in your antibiotic. Please take this to treat your infection Your boyfriend will also need treatment at the same time as your treatment Please come back in 4 weeks for your next OB appointment in our [facility] clinic with either Dr. Lum Babe or Dr. Mauricio Po Remember the daily max of tylenol is 4,000mg    Second Trimester of Pregnancy The second trimester is from week 13 through week 28, months 4 through 6. The second trimester is often a time when you feel your best. Your body has also adjusted to being pregnant, and you begin to feel better physically. Usually, morning sickness has lessened or quit completely, you may have more energy, and you may have an increase in appetite. The second trimester is also a time when the fetus is growing rapidly. At the end of the sixth month, the fetus is about 9 inches long and weighs about 1 pounds. You will likely begin to feel the baby move (quickening) between 18 and 20 weeks of the pregnancy. BODY CHANGES Your body goes through many changes during pregnancy. The changes vary from woman to woman.   Your weight will continue to increase. You will notice your lower abdomen bulging out.  You may begin to get stretch marks on your hips, abdomen, and breasts.  You may develop headaches that can be relieved by medicines approved by your caregiver.  You may urinate more often because the fetus is pressing on your bladder.  You may develop or continue to have heartburn as a result of your pregnancy.  You may develop constipation because certain hormones are causing the muscles that push waste through your intestines to slow down.  You may develop hemorrhoids or swollen, bulging veins (varicose veins).  You may have back pain because of the weight gain and pregnancy hormones relaxing your joints between the bones in your pelvis and as a result of a shift in weight and the muscles that support your balance.  Your breasts will continue to grow and be  tender.  Your gums may bleed and may be sensitive to brushing and flossing.  Dark spots or blotches (chloasma, mask of pregnancy) may develop on your face. This will likely fade after the baby is born.  A dark line from your belly button to the pubic area (linea nigra) may appear. This will likely fade after the baby is born. WHAT TO EXPECT AT YOUR PRENATAL VISITS During a routine prenatal visit:  You will be weighed to make sure you and the fetus are growing normally.  Your blood pressure will be taken.  Your abdomen will be measured to track your baby's growth.  The fetal heartbeat will be listened to.  Any test results from the previous visit will be discussed. Your caregiver may ask you:  How you are feeling.  If you are feeling the baby move.  If you have had any abnormal symptoms, such as leaking fluid, bleeding, severe headaches, or abdominal cramping.  If you have any questions. Other tests that may be performed during your second trimester include:  Blood tests that check for:  Low iron levels (anemia).  Gestational diabetes (between 24 and 28 weeks).  Rh antibodies.  Urine tests to check for infections, diabetes, or protein in the urine.  An ultrasound to confirm the proper growth and development of the baby.  An amniocentesis to check for possible genetic problems.  Fetal screens for spina bifida and Down syndrome. HOME CARE INSTRUCTIONS   Avoid all smoking, herbs,  alcohol, and unprescribed drugs. These chemicals affect the formation and growth of the baby.  Follow your caregiver's instructions regarding medicine use. There are medicines that are either safe or unsafe to take during pregnancy.  Exercise only as directed by your caregiver. Experiencing uterine cramps is a good sign to stop exercising.  Continue to eat regular, healthy meals.  Wear a good support bra for breast tenderness.  Do not use hot tubs, steam rooms, or saunas.  Wear your  seat belt at all times when driving.  Avoid raw meat, uncooked cheese, cat litter boxes, and soil used by cats. These carry germs that can cause birth defects in the baby.  Take your prenatal vitamins.  Try taking a stool softener (if your caregiver approves) if you develop constipation. Eat more high-fiber foods, such as fresh vegetables or fruit and whole grains. Drink plenty of fluids to keep your urine clear or pale yellow.  Take warm sitz baths to soothe any pain or discomfort caused by hemorrhoids. Use hemorrhoid cream if your caregiver approves.  If you develop varicose veins, wear support hose. Elevate your feet for 15 minutes, 3 4 times a day. Limit salt in your diet.  Avoid heavy lifting, wear low heel shoes, and practice good posture.  Rest with your legs elevated if you have leg cramps or low back pain.  Visit your dentist if you have not gone yet during your pregnancy. Use a soft toothbrush to brush your teeth and be gentle when you floss.  A sexual relationship may be continued unless your caregiver directs you otherwise.  Continue to go to all your prenatal visits as directed by your caregiver. SEEK MEDICAL CARE IF:   You have dizziness.  You have mild pelvic cramps, pelvic pressure, or nagging pain in the abdominal area.  You have persistent nausea, vomiting, or diarrhea.  You have a bad smelling vaginal discharge.  You have pain with urination. SEEK IMMEDIATE MEDICAL CARE IF:   You have a fever.  You are leaking fluid from your vagina.  You have spotting or bleeding from your vagina.  You have severe abdominal cramping or pain.  You have rapid weight gain or loss.  You have shortness of breath with chest pain.  You notice sudden or extreme swelling of your face, hands, ankles, feet, or legs.  You have not felt your baby move in over an hour.  You have severe headaches that do not go away with medicine.  You have vision changes. Document Released:  02/15/2001 Document Revised: 10/24/2012 Document Reviewed: 04/24/2012 [facility] Patient Information 2014 [place], [place].

## 2013-01-29 NOTE — Assessment & Plan Note (Signed)
Trich on wet prep. Pt and BF both treated previously w/ test of cure.  Pt aware of implications of test result Metro today

## 2013-01-29 NOTE — Progress Notes (Signed)
HAs. Tylenol 500-1000mg w/o much relief. H/o migraines. Rest w/o relief. Denies LOC, dizziness, loss of vision.  Taking PNV.  Denies contractions, swelling, RUQ pain.  Still w/ whitish vaginal discharge since being Dx w/ BV at [facility] on 10/[facility]. Treated w/ PO metro.   A/P 22 yo G1 at 14.5wks w/ single IUP. - HAs w/ h/o migraines. Cont tylenol prn - f/u in 4 wks in OB clinic - Wet prep today - Flu shot today - Trich and Metro on wet prep. Metro PO. Pt BF to go to health dept for treatment.

## 2013-02-04 NOTE — Progress Notes (Signed)
Patient's chart reviewed, I agree with Dr Satira Sark assessment and plan.  Paula Compton, MD

## 2013-02-19 ENCOUNTER — Other Ambulatory Visit: Payer: Self-pay | Admitting: Family Medicine

## 2013-02-19 DIAGNOSIS — Z3689 Encounter for other specified antenatal screening: Secondary | ICD-10-CM

## 2013-02-21 ENCOUNTER — Other Ambulatory Visit: Payer: Self-pay | Admitting: Family Medicine

## 2013-02-21 ENCOUNTER — Ambulatory Visit (HOSPITAL_COMMUNITY)
Admission: RE | Admit: 2013-02-21 | Discharge: 2013-02-21 | Disposition: A | Discharge status: Home / Self Care | Payer: Medicaid Other | Source: Ambulatory Visit | Attending: Family Medicine | Admitting: Family Medicine

## 2013-02-21 DIAGNOSIS — Z3689 Encounter for other specified antenatal screening: Secondary | ICD-10-CM

## 2013-03-07 NOTE — L&D Delivery Note (Signed)
Delivery Note At 2:50 AM a viable female was delivered via Vaginal, Spontaneous Delivery (Presentation: Left; Occiput Anterior).  APGAR: 5, 8; weight 7#6 .   Placenta status: Intact, Spontaneous.  Cord: 3 vessels with a loose nuchal cord x 1  With crowning,  The fetal heart rate could not be traced for about 4 minutes. Prior to this the tracing had been reactive.  With delivery of the head a loose nuchal cord was noted and reduced and copious thick meconium stained fluid was noted.  After delivery of the body the infant was noted to be floppy and poorly responsive. A code apgar was called and the cord was clamped and cut and the infant was passed to the isolate. Additional personal arrived and the infant responded well to stimulation and deep suctioning.  The placenta delivered spontaneously after cord blood was collected for donation.  A small 1st degree vaginal laceration ws repaired with a 3-0 vicryl figure-of-8 suture. Mom & baby doing well after delivery  Anesthesia: Epidural  Episiotomy: None Lacerations: None Suture Repair: 3.0 vicryl Est. Blood Loss (mL):   Mom to postpartum.  Baby to Couplet care / Skin to Skin.  Freddrick March. Dorothia Passmore 07/26/2013, 3:11 AM

## 2013-03-19 ENCOUNTER — Other Ambulatory Visit: Payer: Self-pay | Admitting: Family Medicine

## 2013-03-19 DIAGNOSIS — IMO0002 Reserved for concepts with insufficient information to code with codable children: Secondary | ICD-10-CM

## 2013-03-19 DIAGNOSIS — Z0489 Encounter for examination and observation for other specified reasons: Secondary | ICD-10-CM

## 2013-03-21 ENCOUNTER — Ambulatory Visit (HOSPITAL_COMMUNITY): Payer: Medicaid Other

## 2013-04-04 ENCOUNTER — Inpatient Hospital Stay (HOSPITAL_COMMUNITY)
Admission: AD | Admit: 2013-04-04 | Discharge: 2013-04-04 | Disposition: A | Discharge status: Home / Self Care | Payer: Medicaid Other | Source: Ambulatory Visit | Attending: Obstetrics and Gynecology | Admitting: Obstetrics and Gynecology

## 2013-04-04 ENCOUNTER — Encounter (HOSPITAL_COMMUNITY): Payer: Self-pay | Admitting: General Practice

## 2013-04-04 DIAGNOSIS — R109 Unspecified abdominal pain: Secondary | ICD-10-CM | POA: Insufficient documentation

## 2013-04-04 DIAGNOSIS — O9989 Other specified diseases and conditions complicating pregnancy, childbirth and the puerperium: Principal | ICD-10-CM

## 2013-04-04 DIAGNOSIS — Z87891 Personal history of nicotine dependence: Secondary | ICD-10-CM | POA: Insufficient documentation

## 2013-04-04 DIAGNOSIS — R0602 Shortness of breath: Secondary | ICD-10-CM | POA: Insufficient documentation

## 2013-04-04 DIAGNOSIS — R102 Pelvic and perineal pain: Secondary | ICD-10-CM

## 2013-04-04 DIAGNOSIS — O99891 Other specified diseases and conditions complicating pregnancy: Secondary | ICD-10-CM | POA: Insufficient documentation

## 2013-04-04 DIAGNOSIS — O26899 Other specified pregnancy related conditions, unspecified trimester: Secondary | ICD-10-CM

## 2013-04-04 MED ORDER — LORATADINE 10 MG PO TABS: 10.0000 mg | ORAL_TABLET | Freq: Every day | ORAL | Status: DC

## 2013-04-04 NOTE — MAU Note (Signed)
Pt presents to MAU with c/o SOB stating that when she was getting ready for school; she was gasping for air. Pt also has c/o left sides pain since 0630 this am. Pt staes the pain is still there and keeps coming and going away

## 2013-04-04 NOTE — MAU Provider Note (Signed)
History     CSN: [id number]  Arrival date and time: 04/04/13 1032   First Provider Initiated Contact with Patient 04/04/13 1113      Chief Complaint  Patient presents with  . Shortness of Breath  . Abdominal Pain   HPI This is a 23 y.o. female at [redacted]w[redacted]d who presents with c/o waking up short of breath first thing this morning.  Felt better within a few minutes. Thinks it is related to mold in apartment. No SOB now or at other times. Also has fleeting sharp pains in round ligament area. No bleeding or contractions.   RN Note: Pt presents to MAU with c/o SOB stating that when she was getting ready for school; she was gasping for air. Pt also has c/o left sides pain since 0630 this am. Pt staes the pain is still there and keeps coming and going away  OB History   Grav Para Term Preterm Abortions TAB SAB Ect Mult Living   1               Past Medical History  Diagnosis Date  . Ovarian cyst   . Migraine   . Chlamydia     History reviewed. No pertinent past surgical history.  Family History  Problem Relation Age of Onset  . Diabetes Maternal Grandmother   . Cancer Maternal Grandfather     lung  . Depression Mother   . Hypertension Mother   . Anxiety disorder Mother   . Bipolar disorder Mother   . Bipolar disorder Father   . Hypertension Maternal Aunt   . Hypertension Maternal Uncle   . Diabetes Maternal Uncle   . Hypertension Paternal Aunt   . Diabetes Paternal Aunt   . Hypertension Paternal Uncle   . Diabetes Paternal Uncle     History  Substance Use Topics  . Smoking status: Former Smoker    Types: Cigarettes, Cigars  . Smokeless tobacco: Never Used  . Alcohol Use: No    Allergies: No Known Allergies  Prescriptions prior to admission  Medication Sig Dispense Refill  . acetaminophen (TYLENOL) 500 MG tablet Take 1,000 mg by mouth daily as needed (for headache).      . Ferrous Sulfate (IRON) 325 (65 FE) MG TABS Take 325 mg by mouth daily.  30 each  11  .  metroNIDAZOLE (FLAGYL) 500 MG tablet Take 1 tablet (500 mg total) by mouth 2 (two) times daily.  14 tablet  0  . Prenatal Vit-Fe Fumarate-FA (PRENATAL MULTIVITAMIN) TABS tablet Take 1 tablet by mouth daily at 12 noon.        Review of Systems  Constitutional: Negative for fever and chills.  Respiratory: Positive for shortness of breath (once this morning, not now). Negative for cough and sputum production.   Cardiovascular: Negative for chest pain.  Gastrointestinal: Negative for nausea, vomiting, abdominal pain (except for fleeting round ligament pains), diarrhea and constipation.  Neurological: Negative for dizziness.   Physical Exam   Blood pressure 119/61, pulse 96, temperature 98.4 F (36.9 C), temperature source Oral, resp. rate 16, height 5\' 7"  (1.702 m), weight 89.359 kg (197 lb), last menstrual period 10/18/2012, SpO2 100.00%.  Physical Exam  Constitutional: She is oriented to person, place, and time. She appears well-developed and well-nourished. No distress.  HENT:  Head: Normocephalic.  Cardiovascular: Normal rate, regular rhythm and normal heart sounds.  Exam reveals no gallop and no friction rub.   No murmur heard. Respiratory: Effort normal and breath sounds normal. No  respiratory distress. She has no wheezes. She has no rales. She exhibits no tenderness.  GI: Soft. She exhibits no distension. There is tenderness (slight over round ligaments). There is no rebound and no guarding.  Musculoskeletal: Normal range of motion.  Neurological: She is alert and oriented to person, place, and time.  Skin: Skin is warm and dry.  Psychiatric: She has a normal mood and affect.  O2 saturation 100%  MAU Course  Procedures   Assessment and Plan  A:  SIUP at [id number]       Episodic shortness of breath, possible supine syndrome or mold in house      Round ligament pains  P:  Discussed with Dr Henderson CloudHorvath      She recommends starting an Antihistamine       Patient is to Call landlord to  take care of mold       Call if worsens or more persistent   [facility]anaWILLIAMS,Zaina Jenkin 04/04/2013, 11:27 AM

## 2013-04-04 NOTE — MAU Note (Signed)
States felt short of breath when getting ready for school this morninig.  (no apparent distress, closed mouth breathing, speaking in full sentences).  This is not a new problem, has felt this throughout the preg, "mold in house". RLQ pain  Noted this morning, pain comes and goes.

## 2013-04-04 NOTE — Discharge Instructions (Signed)

## 2013-04-17 ENCOUNTER — Other Ambulatory Visit: Payer: Self-pay | Admitting: Obstetrics and Gynecology

## 2013-05-09 ENCOUNTER — Encounter: Payer: Self-pay | Admitting: Family Medicine

## 2013-05-09 ENCOUNTER — Ambulatory Visit (INDEPENDENT_AMBULATORY_CARE_PROVIDER_SITE_OTHER): Payer: Medicaid Other | Admitting: Family Medicine

## 2013-05-09 VITALS — BP 127/78 | HR 98 | Ht 67.0 in | Wt 211.0 lb

## 2013-05-09 DIAGNOSIS — Z2089 Contact with and (suspected) exposure to other communicable diseases: Secondary | ICD-10-CM

## 2013-05-09 DIAGNOSIS — Z207 Contact with and (suspected) exposure to pediculosis, acariasis and other infestations: Secondary | ICD-10-CM

## 2013-05-09 MED ORDER — PERMETHRIN 5 % EX CREA: 1.0000 "application " | TOPICAL_CREAM | Freq: Once | CUTANEOUS | Status: DC

## 2013-05-09 NOTE — Progress Notes (Signed)
Veronica Hines is a 23 y.o. female who [facility]ies [facility]  Pt boyfriend and bed mate has a borther with scabies. No itching. No rash.   [redacted]wks pregnant. [facility]. No complications. Regular fetal movemment.    The following portions of the patient's history were reviewed and updated as appropriate: allergies, current medications, past medical history, family and social history, and problem list.  Patient is a nonsmoker  Past Medical History  Diagnosis Date  . Ovarian cyst   . Migraine   . Chlamydia     ROS as above otherwise neg.    Medications reviewed. Current Outpatient Prescriptions  Medication Sig Dispense Refill  . acetaminophen (TYLENOL) 500 MG tablet Take 1,000 mg by mouth daily as needed (for headache).      . Ferrous Sulfate (IRON) 325 (65 FE) MG TABS Take 325 mg by mouth daily.  30 each  11  . loratadine (CLARITIN) 10 MG tablet Take 1 tablet (10 mg total) by mouth daily.  30 tablet  0  . metroNIDAZOLE (FLAGYL) 500 MG tablet Take 1 tablet (500 mg total) by mouth 2 (two) times daily.  14 tablet  0  . Prenatal Vit-Fe Fumarate-FA (PRENATAL MULTIVITAMIN) TABS tablet Take 1 tablet by mouth daily at 12 noon.       No current facility-administered medications for this visit.    Exam:  BP 127/78  Pulse 98  Ht 5\' 7"  (1.702 m)  Wt 211 lb (95.709 kg)  BMI 33.04 kg/m2  LMP 10/18/2012 Gen: Well NAD HEENT: EOMI,  MMM Skin: no rask. intact   No results found for this or any previous visit (from the past 72 hour(s)).  A/P (as seen in Problem list)  No problem-specific assessment & plan notes found for this encounter.   Possible scabies exposure. Promethrin if becomes symptomatic. Safe in pregnancy  Fetal heart rate at 150 today w/o s/s of compromise.

## 2013-05-09 NOTE — Patient Instructions (Signed)
You and the baby appear to be doing very well You are not showing any signs of scabies today Please start the permethrine cream if you become itchy Please come back for any further concerns.  Scabies Scabies are small bugs (mites) that burrow under the skin and cause red bumps and severe itching. These bugs can only be seen with a microscope. Scabies are highly contagious. They can spread easily from person to person by direct contact. They are also spread through sharing clothing or linens that have the scabies mites living in them. It is not unusual for an entire family to become infected through shared towels, clothing, or bedding.  HOME CARE INSTRUCTIONS   Your caregiver may prescribe a cream or lotion to kill the mites. If cream is prescribed, massage the cream into the entire body from the neck to the bottom of both feet. Also massage the cream into the scalp and face if your child is less than 22 year old. Avoid the eyes and mouth. Do not wash your hands after application.  Leave the cream on for 8 to 12 hours. Your child should bathe or shower after the 8 to 12 hour application period. Sometimes it is helpful to apply the cream to your child right before bedtime.  One treatment is usually effective and will eliminate approximately 95% of infestations. For severe cases, your caregiver may decide to repeat the treatment in 1 week. Everyone in your household should be treated with one application of the cream.  New rashes or burrows should not appear within 24 to 48 hours after successful treatment. However, the itching and rash may last for 2 to 4 weeks after successful treatment. Your caregiver may prescribe a medicine to help with the itching or to help the rash go away more quickly.  Scabies can live on clothing or linens for up to 3 days. All of your child's recently used clothing, towels, stuffed toys, and bed linens should be washed in hot water and then dried in a dryer for at least 20  minutes on high heat. Items that cannot be washed should be enclosed in a plastic bag for at least 3 days.  To help relieve itching, bathe your child in a cool bath or apply cool washcloths to the affected areas.  Your child may return to school after treatment with the prescribed cream. SEEK MEDICAL CARE IF:   The itching persists longer than 4 weeks after treatment.  The rash spreads or becomes infected. Signs of infection include red blisters or yellow-tan crust. Document Released: 02/21/2005 Document Revised: 05/16/2011 Document Reviewed: 07/02/2008 [facility] Patient Information 2014 [place], [place].

## 2013-06-20 ENCOUNTER — Encounter (HOSPITAL_COMMUNITY): Payer: Self-pay | Admitting: Family

## 2013-06-20 ENCOUNTER — Inpatient Hospital Stay (HOSPITAL_COMMUNITY)
Admission: AD | Admit: 2013-06-20 | Discharge: 2013-06-20 | Disposition: A | Discharge status: Home / Self Care | Payer: Medicaid Other | Source: Ambulatory Visit | Attending: Obstetrics and Gynecology | Admitting: Obstetrics and Gynecology

## 2013-06-20 DIAGNOSIS — R1013 Epigastric pain: Secondary | ICD-10-CM | POA: Insufficient documentation

## 2013-06-20 DIAGNOSIS — O4700 False labor before 37 completed weeks of gestation, unspecified trimester: Secondary | ICD-10-CM | POA: Insufficient documentation

## 2013-06-20 DIAGNOSIS — O479 False labor, unspecified: Secondary | ICD-10-CM

## 2013-06-20 DIAGNOSIS — Z87891 Personal history of nicotine dependence: Secondary | ICD-10-CM | POA: Insufficient documentation

## 2013-06-20 LAB — CBC
HEMATOCRIT: 33.9 % — AB (ref 36.0–46.0)
HEMOGLOBIN: 11.2 g/dL — AB (ref 12.0–15.0)
MCH: 26 pg (ref 26.0–34.0)
MCHC: 33 g/dL (ref 30.0–36.0)
MCV: 78.7 fL (ref 78.0–100.0)
Platelets: 163 10*3/uL (ref 150–400)
RBC: 4.31 MIL/uL (ref 3.87–5.11)
RDW: 13.9 % (ref 11.5–15.5)
WBC: 8.4 10*3/uL (ref 4.0–10.5)

## 2013-06-20 LAB — COMPREHENSIVE METABOLIC PANEL
ALT: 11 U/L (ref 0–35)
AST: 16 U/L (ref 0–37)
Albumin: 3 g/dL — ABNORMAL LOW (ref 3.5–5.2)
Alkaline Phosphatase: 78 U/L (ref 39–117)
BILIRUBIN TOTAL: 0.2 mg/dL — AB (ref 0.3–1.2)
BUN: 9 mg/dL (ref 6–23)
CO2: 20 meq/L (ref 19–32)
CREATININE: 0.63 mg/dL (ref 0.50–1.10)
Calcium: 9 mg/dL (ref 8.4–10.5)
Chloride: 101 mEq/L (ref 96–112)
GFR calc Af Amer: >90 mL/min (ref >90)
GLUCOSE: 84 mg/dL (ref 70–99)
POTASSIUM: 3.8 meq/L (ref 3.7–5.3)
Sodium: 135 mEq/L — ABNORMAL LOW (ref 137–147)
Total Protein: 6.5 g/dL (ref 6.0–8.3)

## 2013-06-20 LAB — URINALYSIS, ROUTINE W REFLEX MICROSCOPIC
Bilirubin Urine: NEGATIVE
GLUCOSE, UA: NEGATIVE mg/dL
HGB URINE DIPSTICK: NEGATIVE
Ketones, ur: NEGATIVE mg/dL
Leukocytes, UA: NEGATIVE
Nitrite: NEGATIVE
PH: 6 (ref 5.0–8.0)
PROTEIN: NEGATIVE mg/dL
Urobilinogen, UA: 0.2 mg/dL (ref 0.0–1.0)

## 2013-06-20 LAB — LIPASE, BLOOD: Lipase: 35 U/L (ref 11–59)

## 2013-06-20 LAB — AMYLASE: AMYLASE: 121 U/L — AB (ref 0–105)

## 2013-06-20 MED ORDER — GI COCKTAIL ~~LOC~~
30.0000 mL | Freq: Once | ORAL | Status: AC
  Administered 2013-06-20: 30 mL via ORAL
  Filled 2013-06-20: qty 30

## 2013-06-20 NOTE — MAU Note (Signed)
Since last night has been having streaks of pain shooting up through stomach.

## 2013-06-20 NOTE — MAU Note (Signed)
23 yo, G1P0 at 5415w0d, presents to MAU with c/o shooting abdominal pains from epigastric area into lower abdomen since last night. Denies VB, LOF. Reports +FM.

## 2013-06-20 NOTE — MAU Provider Note (Signed)
History     CSN: [id number]  Arrival date and time: 06/20/13 1552   First Provider Initiated Contact with Patient 06/20/13 1645      Chief Complaint  Patient presents with  . Abdominal Pain   HPI  Veronica Hines is a 23 y.o. G1P0 at 6662w0d who presents today with epigastric pain that "starts at the top of my stomach and then shoots all over my stomach". She does report nausea, but no vomiting. She has not taken anything for the pain at this time. She denies any bleeding, LOF or contractions.   Past Medical History  Diagnosis Date  . Ovarian cyst   . Migraine   . Chlamydia     History reviewed. No pertinent past surgical history.  Family History  Problem Relation Age of Onset  . Diabetes Maternal Grandmother   . Cancer Maternal Grandfather     lung  . Depression Mother   . Hypertension Mother   . Anxiety disorder Mother   . Bipolar disorder Mother   . Bipolar disorder Father   . Hypertension Maternal Aunt   . Hypertension Maternal Uncle   . Diabetes Maternal Uncle   . Hypertension Paternal Aunt   . Diabetes Paternal Aunt   . Hypertension Paternal Uncle   . Diabetes Paternal Uncle     History  Substance Use Topics  . Smoking status: Former Smoker    Types: Cigarettes, Cigars  . Smokeless tobacco: Never Used  . Alcohol Use: No    Allergies: No Known Allergies  Prescriptions prior to admission  Medication Sig Dispense Refill  . Prenatal Vit-Fe Fumarate-FA (PRENATAL MULTIVITAMIN) TABS tablet Take 1 tablet by mouth daily at 12 noon.        ROS Physical Exam   Blood pressure 113/65, pulse 100, temperature 98.3 F (36.8 C), temperature source Oral, resp. rate 18, height 5\' 7"  (1.702 m), weight 100.699 kg (222 lb), last menstrual period 10/18/2012.  Physical Exam  Nursing note and vitals reviewed. Constitutional: She is oriented to person, place, and time. She appears well-developed and well-nourished. No distress.  Cardiovascular: Normal rate.    Respiratory: Effort normal.  GI: Soft. There is no tenderness. There is no rebound.  Genitourinary:   Cervix: closed/thick/high  Neurological: She is alert and oriented to person, place, and time.  Skin: Skin is warm and dry.  Psychiatric: She has a normal mood and affect.   FHT: 140, moderate with 15x15 accels, no decels Toco: Irregular UI MAU Course  Procedures  Results for orders placed during the hospital encounter of 06/20/13 (from the past 24 hour(s))  URINALYSIS, ROUTINE W REFLEX MICROSCOPIC     Status: Abnormal   Collection Time    06/20/13  4:00 PM      Result Value Ref Range   Color, Urine YELLOW  YELLOW   APPearance CLEAR  CLEAR   Specific Gravity, Urine >1.030 (*) 1.005 - 1.030   pH 6.0  5.0 - 8.0   Glucose, UA NEGATIVE  NEGATIVE mg/dL   Hgb urine dipstick NEGATIVE  NEGATIVE   Bilirubin Urine NEGATIVE  NEGATIVE   Ketones, ur NEGATIVE  NEGATIVE mg/dL   Protein, ur NEGATIVE  NEGATIVE mg/dL   Urobilinogen, UA 0.2  0.0 - 1.0 mg/dL   Nitrite NEGATIVE  NEGATIVE   Leukocytes, UA NEGATIVE  NEGATIVE  CBC     Status: Abnormal   Collection Time    06/20/13  4:54 PM      Result Value Ref Range  WBC 8.4  4.0 - 10.5 K/uL   RBC 4.31  3.87 - 5.11 MIL/uL   Hemoglobin 11.2 (*) 12.0 - 15.0 g/dL   HCT 09.833.9 (*) 11.936.0 - 14.746.0 %   MCV 78.7  78.0 - 100.0 fL   MCH 26.0  26.0 - 34.0 pg   MCHC 33.0  30.0 - 36.0 g/dL   RDW 82.913.9  56.211.5 - 13.015.5 %   Platelets 163  150 - 400 K/uL  COMPREHENSIVE METABOLIC PANEL     Status: Abnormal   Collection Time    06/20/13  4:54 PM      Result Value Ref Range   Sodium 135 (*) 137 - 147 mEq/L   Potassium 3.8  3.7 - 5.3 mEq/L   Chloride 101  96 - 112 mEq/L   CO2 20  19 - 32 mEq/L   Glucose, Bld 84  70 - 99 mg/dL   BUN 9  6 - 23 mg/dL   Creatinine, Ser 8.650.63  0.50 - 1.10 mg/dL   Calcium 9.0  8.4 - 78.410.5 mg/dL   Total Protein 6.5  6.0 - 8.3 g/dL   Albumin 3.0 (*) 3.5 - 5.2 g/dL   AST 16  0 - 37 U/L   ALT 11  0 - 35 U/L   Alkaline Phosphatase 78   39 - 117 U/L   Total Bilirubin 0.2 (*) 0.3 - 1.2 mg/dL   GFR calc non Af Amer >90  >90 mL/min   GFR calc Af Amer >90  >90 mL/min   1811: D/W Dr. Claiborne Billingsallahan ok for DC home.  Assessment and Plan   1. Braxton Hick's contraction    PTL precautions Fetal kick counts Return to MAU as needed  Follow-up Information   [facility] (As scheduled)    Contact information:   [address] [place] [id number] [phone]       Tawnya CrookHeather Donovan Hogan 06/20/2013, 4:47 PM

## 2013-06-20 NOTE — Discharge Instructions (Signed)

## 2013-06-25 ENCOUNTER — Other Ambulatory Visit: Payer: Self-pay | Admitting: Obstetrics & Gynecology

## 2013-06-28 LAB — OB RESULTS CONSOLE GBS: STREP GROUP B AG: NEGATIVE

## 2013-07-08 ENCOUNTER — Encounter (HOSPITAL_COMMUNITY): Payer: Self-pay | Admitting: *Deleted

## 2013-07-08 ENCOUNTER — Inpatient Hospital Stay (HOSPITAL_COMMUNITY)
Admission: AD | Admit: 2013-07-08 | Discharge: 2013-07-08 | Disposition: A | Discharge status: Home / Self Care | Payer: Medicaid Other | Source: Ambulatory Visit | Attending: Obstetrics & Gynecology | Admitting: Obstetrics & Gynecology

## 2013-07-08 DIAGNOSIS — O99891 Other specified diseases and conditions complicating pregnancy: Secondary | ICD-10-CM | POA: Insufficient documentation

## 2013-07-08 DIAGNOSIS — M545 Low back pain, unspecified: Secondary | ICD-10-CM | POA: Insufficient documentation

## 2013-07-08 DIAGNOSIS — O9989 Other specified diseases and conditions complicating pregnancy, childbirth and the puerperium: Principal | ICD-10-CM

## 2013-07-08 HISTORY — DX: Anemia, unspecified: D64.9

## 2013-07-08 HISTORY — DX: Gastro-esophageal reflux disease without esophagitis: K21.9

## 2013-07-08 LAB — POCT FERN TEST: POCT Fern Test: NEGATIVE

## 2013-07-08 LAB — AMNISURE RUPTURE OF MEMBRANE (ROM) NOT AT ARMC: Amnisure ROM: NEGATIVE

## 2013-07-08 NOTE — MAU Note (Signed)
Pt reports leaking fluid at 1915, lower back pain.

## 2013-07-08 NOTE — Discharge Instructions (Signed)

## 2013-07-16 ENCOUNTER — Encounter (HOSPITAL_COMMUNITY): Payer: Self-pay | Admitting: *Deleted

## 2013-07-16 ENCOUNTER — Inpatient Hospital Stay (HOSPITAL_COMMUNITY)
Admission: AD | Admit: 2013-07-16 | Discharge: 2013-07-16 | Disposition: A | Discharge status: Home / Self Care | Payer: Medicaid Other | Source: Ambulatory Visit | Attending: Obstetrics and Gynecology | Admitting: Obstetrics and Gynecology

## 2013-07-16 ENCOUNTER — Inpatient Hospital Stay (HOSPITAL_COMMUNITY): Payer: Medicaid Other

## 2013-07-16 DIAGNOSIS — O368190 Decreased fetal movements, unspecified trimester, not applicable or unspecified: Secondary | ICD-10-CM | POA: Insufficient documentation

## 2013-07-16 NOTE — Discharge Instructions (Signed)
Fetal Movement Counts Patient Name: __________________________________________________ Patient Due Date: ____________________ Performing a fetal movement count is highly recommended in high-risk pregnancies, but it is good for every pregnant woman to do. Your caregiver may ask you to start counting fetal movements at 28 weeks of the pregnancy. Fetal movements often increase:  After eating a full meal.  After physical activity.  After eating or drinking something sweet or cold.  At rest. Pay attention to when you feel the baby is most active. This will help you notice a pattern of your baby's sleep and wake cycles and what factors contribute to an increase in fetal movement. It is important to perform a fetal movement count at the same time each day when your baby is normally most active.  HOW TO COUNT FETAL MOVEMENTS 1. Find a quiet and comfortable area to sit or lie down on your left side. Lying on your left side provides the best blood and oxygen circulation to your baby. 2. Write down the day and time on a sheet of paper or in a journal. 3. Start counting kicks, flutters, swishes, rolls, or jabs in a 2 hour period. You should feel at least 10 movements within 2 hours. 4. If you do not feel 10 movements in 2 hours, wait 2 3 hours and count again. Look for a change in the pattern or not enough counts in 2 hours. SEEK MEDICAL CARE IF:  You feel less than 10 counts in 2 hours, tried twice.  There is no movement in over an hour.  The pattern is changing or taking longer each day to reach 10 counts in 2 hours.  You feel the baby is not moving as he or she usually does. Date: ____________ Movements: ____________ Start time: ____________ Finish time: ____________  Date: ____________ Movements: ____________ Start time: ____________ Finish time: ____________ Date: ____________ Movements: ____________ Start time: ____________ Finish time: ____________ Date: ____________ Movements: ____________  Start time: ____________ Finish time: ____________ Date: ____________ Movements: ____________ Start time: ____________ Finish time: ____________ Date: ____________ Movements: ____________ Start time: ____________ Finish time: ____________ Date: ____________ Movements: ____________ Start time: ____________ Finish time: ____________ Date: ____________ Movements: ____________ Start time: ____________ Finish time: ____________  Date: ____________ Movements: ____________ Start time: ____________ Finish time: ____________ Date: ____________ Movements: ____________ Start time: ____________ Finish time: ____________ Date: ____________ Movements: ____________ Start time: ____________ Finish time: ____________ Date: ____________ Movements: ____________ Start time: ____________ Finish time: ____________ Date: ____________ Movements: ____________ Start time: ____________ Finish time: ____________ Date: ____________ Movements: ____________ Start time: ____________ Finish time: ____________ Date: ____________ Movements: ____________ Start time: ____________ Finish time: ____________  Date: ____________ Movements: ____________ Start time: ____________ Finish time: ____________ Date: ____________ Movements: ____________ Start time: ____________ Finish time: ____________ Date: ____________ Movements: ____________ Start time: ____________ Finish time: ____________ Date: ____________ Movements: ____________ Start time: ____________ Finish time: ____________ Date: ____________ Movements: ____________ Start time: ____________ Finish time: ____________ Date: ____________ Movements: ____________ Start time: ____________ Finish time: ____________ Date: ____________ Movements: ____________ Start time: ____________ Finish time: ____________  Date: ____________ Movements: ____________ Start time: ____________ Finish time: ____________ Date: ____________ Movements: ____________ Start time: ____________ Finish time:  ____________ Date: ____________ Movements: ____________ Start time: ____________ Finish time: ____________ Date: ____________ Movements: ____________ Start time: ____________ Finish time: ____________ Date: ____________ Movements: ____________ Start time: ____________ Finish time: ____________ Date: ____________ Movements: ____________ Start time: ____________ Finish time: ____________ Date: ____________ Movements: ____________ Start time: ____________ Finish time: ____________  Date: ____________ Movements: ____________ Start time: ____________ Finish   time: ____________ Date: ____________ Movements: ____________ Start time: ____________ Finish time: ____________ Date: ____________ Movements: ____________ Start time: ____________ Finish time: ____________ Date: ____________ Movements: ____________ Start time: ____________ Finish time: ____________ Date: ____________ Movements: ____________ Start time: ____________ Finish time: ____________ Date: ____________ Movements: ____________ Start time: ____________ Finish time: ____________ Date: ____________ Movements: ____________ Start time: ____________ Finish time: ____________  Date: ____________ Movements: ____________ Start time: ____________ Finish time: ____________ Date: ____________ Movements: ____________ Start time: ____________ Finish time: ____________ Date: ____________ Movements: ____________ Start time: ____________ Finish time: ____________ Date: ____________ Movements: ____________ Start time: ____________ Finish time: ____________ Date: ____________ Movements: ____________ Start time: ____________ Finish time: ____________ Date: ____________ Movements: ____________ Start time: ____________ Finish time: ____________ Date: ____________ Movements: ____________ Start time: ____________ Finish time: ____________  Date: ____________ Movements: ____________ Start time: ____________ Finish time: ____________ Date: ____________ Movements:  ____________ Start time: ____________ Finish time: ____________ Date: ____________ Movements: ____________ Start time: ____________ Finish time: ____________ Date: ____________ Movements: ____________ Start time: ____________ Finish time: ____________ Date: ____________ Movements: ____________ Start time: ____________ Finish time: ____________ Date: ____________ Movements: ____________ Start time: ____________ Finish time: ____________ Date: ____________ Movements: ____________ Start time: ____________ Finish time: ____________  Date: ____________ Movements: ____________ Start time: ____________ Finish time: ____________ Date: ____________ Movements: ____________ Start time: ____________ Finish time: ____________ Date: ____________ Movements: ____________ Start time: ____________ Finish time: ____________ Date: ____________ Movements: ____________ Start time: ____________ Finish time: ____________ Date: ____________ Movements: ____________ Start time: ____________ Finish time: ____________ Date: ____________ Movements: ____________ Start time: ____________ Finish time: ____________ Document Released: 03/23/2006 Document Revised: 02/08/2012 Document Reviewed: 12/19/2011 ExitCare Patient Information 2014 [employer].  

## 2013-07-16 NOTE — MAU Note (Signed)
Spoke to pt in lobby, had EFM at office, here for more monitoring, baby moving at this time, less today

## 2013-07-16 NOTE — MAU Provider Note (Signed)
S:   23 yo G1 @ 648w0d who was sent from clinic after variable seen on NST for decreased FM.  - states only feels baby move about 4 times in 2-3 hours over the last 3 days.  - no vb, lof. No regular contractions.    O:   Filed Vitals:   07/16/13 1809  BP: 124/78  Pulse: 92  Resp: 16    GEN: NAD PULM: normal effort CV: RRR ABD: gravid, NT NST- 145/150, mod var, + 15x15, no decels TOCO- quiet  A/P 22yo G1 @ 618w0d sent from clinic for variable on NST.   Cat I tracing NST here without concern BPP of 8/8 Reassurance provided Labor precautions discussed.    Case discussed with Dr. Dareen PianoAnderson.    Vale HavenKeli L [facility], MD

## 2013-07-16 NOTE — MAU Note (Signed)
C/o decreased fetal movement since this Am around 1000; sent from OB's office to be assessed in MAU;

## 2013-07-23 ENCOUNTER — Encounter (HOSPITAL_COMMUNITY): Payer: Self-pay | Admitting: *Deleted

## 2013-07-23 ENCOUNTER — Inpatient Hospital Stay (HOSPITAL_COMMUNITY)
Admission: AD | Admit: 2013-07-23 | Discharge: 2013-07-23 | Disposition: A | Discharge status: Home / Self Care | Payer: Medicaid Other | Source: Ambulatory Visit | Attending: Obstetrics and Gynecology | Admitting: Obstetrics and Gynecology

## 2013-07-23 ENCOUNTER — Inpatient Hospital Stay (HOSPITAL_COMMUNITY): Payer: Medicaid Other

## 2013-07-23 DIAGNOSIS — R109 Unspecified abdominal pain: Secondary | ICD-10-CM | POA: Insufficient documentation

## 2013-07-23 DIAGNOSIS — O479 False labor, unspecified: Secondary | ICD-10-CM | POA: Insufficient documentation

## 2013-07-23 NOTE — MAU Note (Signed)
Pt reports pressure in lower abd all day, also reports contractions and back pain.

## 2013-07-23 NOTE — Discharge Instructions (Signed)
Drink plenty of fluids. Get at least 64 ounces of water a day.   Braxton Hicks Contractions Pregnancy is commonly associated with contractions of the uterus throughout the pregnancy. Towards the end of pregnancy (32 to 34 weeks), these contractions [facility](Braxton Willa RoughHicks) can develop more often and may become more forceful. This is not true labor because these contractions do not result in opening (dilatation) and thinning of the cervix. They are sometimes difficult to tell apart from true labor because these contractions can be forceful and people have different pain tolerances. You should not feel embarrassed if you go to the hospital with false labor. Sometimes, the only way to tell if you are in true labor is for your caregiver to follow the changes in the cervix. How to tell the difference between true and false labor:  False labor.  The contractions of false labor are usually shorter, irregular and not as hard as those of true labor.  They are often felt in the front of the lower abdomen and in the groin.  They may leave with walking around or changing positions while lying down.  They get weaker and are shorter lasting as time goes on.  These contractions are usually irregular.  They do not usually become progressively stronger, regular and closer together as with true labor.  True labor.  Contractions in true labor last 30 to 70 seconds, become very regular, usually become more intense, and increase in frequency.  They do not go away with walking.  The discomfort is usually felt in the top of the uterus and spreads to the lower abdomen and low back.  True labor can be determined by your caregiver with an exam. This will show that the cervix is dilating and getting thinner. If there are no prenatal problems or other health problems associated with the pregnancy, it is completely safe to be sent home with false labor and await the onset of true labor. HOME CARE INSTRUCTIONS   Keep up  with your usual exercises and instructions.  Take medications as directed.  Keep your regular prenatal appointment.  Eat and drink lightly if you think you are going into labor.  If BH contractions are making you uncomfortable:  Change your activity position from lying down or resting to walking/walking to resting.  Sit and rest in a tub of warm water.  Drink 2 to 3 glasses of water. Dehydration may cause B-H contractions.  Do slow and deep breathing several times an hour. SEEK IMMEDIATE MEDICAL CARE IF:   Your contractions continue to become stronger, more regular, and closer together.  You have a gushing, burst or leaking of fluid from the vagina.  An oral temperature above 102 F (38.9 C) develops.  You have passage of blood-tinged mucus.  You develop vaginal bleeding.  You develop continuous belly (abdominal) pain.  You have low back pain that you never had before.  You feel the baby's head pushing down causing pelvic pressure.  The baby is not moving as much as it used to. Document Released: 02/21/2005 Document Revised: 05/16/2011 Document Reviewed: 12/03/2012 [facility] Patient Information 2014 [place], [place].

## 2013-07-25 ENCOUNTER — Encounter (HOSPITAL_COMMUNITY): Payer: Medicaid Other | Admitting: Anesthesiology

## 2013-07-25 ENCOUNTER — Inpatient Hospital Stay (HOSPITAL_COMMUNITY): Payer: Medicaid Other | Admitting: Anesthesiology

## 2013-07-25 ENCOUNTER — Encounter (HOSPITAL_COMMUNITY): Payer: Self-pay | Admitting: *Deleted

## 2013-07-25 ENCOUNTER — Inpatient Hospital Stay (HOSPITAL_COMMUNITY)
Admission: AD | Admit: 2013-07-25 | Discharge: 2013-07-28 | DRG: 775 | Disposition: A | Discharge status: Home / Self Care | Payer: Medicaid Other | Source: Ambulatory Visit | Attending: Obstetrics and Gynecology | Admitting: Obstetrics and Gynecology

## 2013-07-25 DIAGNOSIS — O4290 Premature rupture of membranes, unspecified as to length of time between rupture and onset of labor, unspecified weeks of gestation: Secondary | ICD-10-CM

## 2013-07-25 DIAGNOSIS — K219 Gastro-esophageal reflux disease without esophagitis: Secondary | ICD-10-CM | POA: Diagnosis present

## 2013-07-25 DIAGNOSIS — D649 Anemia, unspecified: Secondary | ICD-10-CM | POA: Diagnosis present

## 2013-07-25 DIAGNOSIS — O9902 Anemia complicating childbirth: Secondary | ICD-10-CM | POA: Diagnosis present

## 2013-07-25 DIAGNOSIS — Z833 Family history of diabetes mellitus: Secondary | ICD-10-CM

## 2013-07-25 DIAGNOSIS — Z87891 Personal history of nicotine dependence: Secondary | ICD-10-CM

## 2013-07-25 HISTORY — DX: Premature rupture of membranes, unspecified as to length of time between rupture and onset of labor, unspecified weeks of gestation: O42.90

## 2013-07-25 LAB — TYPE AND SCREEN
ABO/RH(D): O POS
ANTIBODY SCREEN: NEGATIVE

## 2013-07-25 LAB — CBC
HEMATOCRIT: 38.4 % (ref 36.0–46.0)
Hemoglobin: 12.6 g/dL (ref 12.0–15.0)
MCH: 25.9 pg — AB (ref 26.0–34.0)
MCHC: 32.8 g/dL (ref 30.0–36.0)
MCV: 78.9 fL (ref 78.0–100.0)
Platelets: 163 10*3/uL (ref 150–400)
RBC: 4.87 MIL/uL (ref 3.87–5.11)
RDW: 14 % (ref 11.5–15.5)
WBC: 9.7 10*3/uL (ref 4.0–10.5)

## 2013-07-25 LAB — RPR

## 2013-07-25 LAB — POCT FERN TEST: POCT Fern Test: NEGATIVE

## 2013-07-25 LAB — AMNISURE RUPTURE OF MEMBRANE (ROM) NOT AT ARMC: AMNISURE: POSITIVE

## 2013-07-25 MED ORDER — EPHEDRINE 5 MG/ML INJ
10.0000 mg | INTRAVENOUS | Status: DC | PRN
Start: 2013-07-25 — End: 2013-07-26
  Filled 2013-07-25: qty 2

## 2013-07-25 MED ORDER — LACTATED RINGERS IV SOLN
500.0000 mL | Freq: Once | INTRAVENOUS | Status: AC
  Administered 2013-07-25: 500 mL via INTRAVENOUS

## 2013-07-25 MED ORDER — OXYCODONE-ACETAMINOPHEN 5-325 MG PO TABS: 1.0000 | ORAL_TABLET | ORAL | Status: DC | PRN

## 2013-07-25 MED ORDER — LIDOCAINE HCL (PF) 1 % IJ SOLN
30.0000 mL | INTRAMUSCULAR | Status: DC | PRN
  Filled 2013-07-25: qty 30

## 2013-07-25 MED ORDER — OXYTOCIN BOLUS FROM INFUSION
500.0000 mL | INTRAVENOUS | Status: DC
  Administered 2013-07-26: 500 mL via INTRAVENOUS

## 2013-07-25 MED ORDER — BUTORPHANOL TARTRATE 1 MG/ML IJ SOLN: 1.0000 mg | INTRAMUSCULAR | Status: DC | PRN

## 2013-07-25 MED ORDER — SODIUM CHLORIDE 0.9 % IV SOLN
1.0000 g | INTRAVENOUS | Status: DC
  Filled 2013-07-25 (×4): qty 1000

## 2013-07-25 MED ORDER — IBUPROFEN 600 MG PO TABS: 600.0000 mg | ORAL_TABLET | Freq: Four times a day (QID) | ORAL | Status: DC | PRN

## 2013-07-25 MED ORDER — LACTATED RINGERS IV SOLN
INTRAVENOUS | Status: DC
  Administered 2013-07-25 (×3): via INTRAVENOUS

## 2013-07-25 MED ORDER — ONDANSETRON HCL 4 MG/2ML IJ SOLN
4.0000 mg | Freq: Four times a day (QID) | INTRAMUSCULAR | Status: DC | PRN
  Administered 2013-07-25: 4 mg via INTRAVENOUS
  Filled 2013-07-25: qty 2

## 2013-07-25 MED ORDER — FENTANYL 2.5 MCG/ML BUPIVACAINE 1/10 % EPIDURAL INFUSION (WH - ANES)
14.0000 mL/h | INTRAMUSCULAR | Status: DC | PRN
  Administered 2013-07-25: 14 mL/h via EPIDURAL
  Filled 2013-07-25: qty 125

## 2013-07-25 MED ORDER — DIPHENHYDRAMINE HCL 50 MG/ML IJ SOLN: 12.5000 mg | INTRAMUSCULAR | Status: DC | PRN

## 2013-07-25 MED ORDER — PHENYLEPHRINE 40 MCG/ML (10ML) SYRINGE FOR IV PUSH (FOR BLOOD PRESSURE SUPPORT)
80.0000 ug | PREFILLED_SYRINGE | INTRAVENOUS | Status: DC | PRN
  Filled 2013-07-25: qty 2

## 2013-07-25 MED ORDER — EPHEDRINE 5 MG/ML INJ
INTRAVENOUS | Status: AC
  Filled 2013-07-25: qty 4

## 2013-07-25 MED ORDER — SODIUM CHLORIDE 0.9 % IV SOLN
2.0000 g | Freq: Once | INTRAVENOUS | Status: AC
  Administered 2013-07-25: 2 g via INTRAVENOUS
  Filled 2013-07-25: qty 2000

## 2013-07-25 MED ORDER — CITRIC ACID-SODIUM CITRATE 334-500 MG/5ML PO SOLN: 30.0000 mL | ORAL | Status: DC | PRN

## 2013-07-25 MED ORDER — ACETAMINOPHEN 325 MG PO TABS: 650.0000 mg | ORAL_TABLET | ORAL | Status: DC | PRN

## 2013-07-25 MED ORDER — OXYTOCIN 40 UNITS IN LACTATED RINGERS INFUSION - SIMPLE MED
62.5000 mL/h | INTRAVENOUS | Status: DC
  Administered 2013-07-26: 62.5 mL/h via INTRAVENOUS

## 2013-07-25 MED ORDER — ZOLPIDEM TARTRATE 5 MG PO TABS: 5.0000 mg | ORAL_TABLET | Freq: Every evening | ORAL | Status: DC | PRN

## 2013-07-25 MED ORDER — LACTATED RINGERS IV SOLN: 500.0000 mL | INTRAVENOUS | Status: DC | PRN

## 2013-07-25 MED ORDER — LIDOCAINE HCL (PF) 1 % IJ SOLN
INTRAMUSCULAR | Status: DC | PRN
  Administered 2013-07-25 (×2): 5 mL

## 2013-07-25 MED ORDER — EPHEDRINE 5 MG/ML INJ
10.0000 mg | INTRAVENOUS | Status: DC | PRN
  Filled 2013-07-25: qty 2

## 2013-07-25 MED ORDER — OXYTOCIN 40 UNITS IN LACTATED RINGERS INFUSION - SIMPLE MED
1.0000 m[IU]/min | INTRAVENOUS | Status: DC
  Administered 2013-07-25: 2 m[IU]/min via INTRAVENOUS
  Administered 2013-07-26: 14 m[IU]/min via INTRAVENOUS
  Filled 2013-07-25: qty 1000

## 2013-07-25 MED ORDER — TERBUTALINE SULFATE 1 MG/ML IJ SOLN: 0.2500 mg | Freq: Once | INTRAMUSCULAR | Status: AC | PRN

## 2013-07-25 MED ORDER — ACETAMINOPHEN 500 MG PO TABS
1000.0000 mg | ORAL_TABLET | Freq: Once | ORAL | Status: AC
  Administered 2013-07-25: 1000 mg via ORAL
  Filled 2013-07-25: qty 2

## 2013-07-25 MED ORDER — FENTANYL 2.5 MCG/ML BUPIVACAINE 1/10 % EPIDURAL INFUSION (WH - ANES)
INTRAMUSCULAR | Status: AC
  Administered 2013-07-25: 14 mL/h via EPIDURAL
  Filled 2013-07-25: qty 125

## 2013-07-25 MED ORDER — PHENYLEPHRINE 40 MCG/ML (10ML) SYRINGE FOR IV PUSH (FOR BLOOD PRESSURE SUPPORT)
PREFILLED_SYRINGE | INTRAVENOUS | Status: AC
  Filled 2013-07-25: qty 10

## 2013-07-25 NOTE — MAU Note (Signed)
Started leaking last night around 2030, clear- pinkish tinged. Last saw fluid around 0630. Having contractions.

## 2013-07-25 NOTE — Progress Notes (Signed)
Dr Tenny Crawoss notified of amnisure results, orders received to admit pt.

## 2013-07-25 NOTE — H&P (Signed)
Veronica Hines is a 23 y.o. female presenting for  Leaking fluid 23 year old G1 P0 female presents at 40 weeks and two days estimated gestational age for the complaint of leaking fluid. Initially and triage unit should her fern was negative.  However, as she stood up to be discharged home she had spontaneous gushing of a large volume of fluid She will be admitted to labor and delivery..  Her pregnancy has been uncomplicated to this point. History OB History   Grav Para Term Preterm Abortions TAB SAB Ect Mult Living   1         0     Past Medical History  Diagnosis Date  . Ovarian cyst   . Migraine   . Chlamydia   . GERD (gastroesophageal reflux disease)   . Anemia    History reviewed. No pertinent past surgical history. Family History: family history includes Anxiety disorder in her mother; Bipolar disorder in her father and mother; Cancer in her maternal grandfather; Depression in her mother; Diabetes in her maternal grandmother, maternal uncle, paternal aunt, and paternal uncle; Hypertension in her maternal aunt, maternal uncle, mother, paternal aunt, and paternal uncle. Social History:  reports that she quit smoking about 2 years ago. Her smoking use included Cigarettes and Cigars. She smoked 0.00 packs per day. She has never used smokeless tobacco. She reports that she does not drink alcohol or use illicit drugs.   Prenatal Transfer Tool  Maternal Diabetes:  None Genetic Screening:  normal Maternal Ultrasounds/Referrals:  normal Fetal Ultrasounds or other Referrals:   normal Maternal Substance Abuse:   denies Significant Maternal Medical: none Significant Maternal Lab Results:   GBS negative Other  none:  none  ROS as above  Dilation: 1.5 Effacement (%): 50 Exam by:: g morris rn Blood pressure 114/77, pulse 100, temperature 98.1 F (36.7 C), resp. rate 18, height 5' 6.5" (1.689 m), weight 101.152 kg (223 lb), last menstrual period 10/18/2012. Exam Physical Exam  Prenatal  labs: ABO, Rh: O/POS/-- (10/06 0948) Antibody: NEG (10/06 0948) Rubella: 3.82 (10/06 0948) RPR: NON REAC (10/06 0948)  HBsAg: NEGATIVE (10/06 0948)  HIV: NON REACTIVE (10/06 0948)  GBS:   negative   Assessment-  1) admit 2) epidural on request  3) augment labor as necessary   Glennon Kopko H. Chiyo Fay 07/25/2013, 10:21 AM

## 2013-07-25 NOTE — Discharge Instructions (Signed)

## 2013-07-25 NOTE — Anesthesia Procedure Notes (Signed)
Epidural Patient location during procedure: OB Start time: 07/25/2013 2:02 PM  Staffing Anesthesiologist: Brayton CavesJACKSON, Theadora Noyes Performed by: anesthesiologist   Preanesthetic Checklist Completed: patient identified, site marked, surgical consent, pre-op evaluation, timeout performed, IV checked, risks and benefits discussed and monitors and equipment checked  Epidural Patient position: sitting Prep: site prepped and draped and DuraPrep Patient monitoring: continuous pulse ox and blood pressure Approach: midline Location: L3-L4 Injection technique: LOR air  Needle:  Needle type: Tuohy  Needle gauge: 17 G Needle length: 9 cm and 9 Needle insertion depth: 5 cm cm Catheter type: closed end flexible Catheter size: 19 Gauge Catheter at skin depth: 10 cm Test dose: negative  Assessment Events: blood not aspirated, injection not painful, no injection resistance, negative IV test and no paresthesia  Additional Notes Patient identified.  Risk benefits discussed including failed block, incomplete pain control, headache, nerve damage, paralysis, blood pressure changes, nausea, vomiting, reactions to medication both toxic or allergic, and postpartum back pain.  Patient expressed understanding and wished to proceed.  All questions were answered.  Sterile technique used throughout procedure and epidural site dressed with sterile barrier dressing. No paresthesia or other complications noted.The patient did not experience any signs of intravascular injection such as tinnitus or metallic taste in mouth nor signs of intrathecal spread such as rapid motor block. Please see nursing notes for vital signs.

## 2013-07-25 NOTE — Anesthesia Preprocedure Evaluation (Signed)
Anesthesia Evaluation  Patient identified by MRN, date of birth, ID band Patient awake    Reviewed: Allergy & Precautions, H&P , Patient's Chart, lab work & pertinent test results  Airway Mallampati: II TM Distance: >3 FB Neck ROM: full    Dental   Pulmonary former smoker,  breath sounds clear to auscultation        Cardiovascular Rhythm:regular Rate:Normal     Neuro/Psych    GI/Hepatic GERD-  ,  Endo/Other    Renal/GU      Musculoskeletal   Abdominal   Peds  Hematology  (+) anemia ,   Anesthesia Other Findings   Reproductive/Obstetrics (+) Pregnancy                           Anesthesia Physical Anesthesia Plan  ASA: II  Anesthesia Plan: Epidural   Post-op Pain Management:    Induction:   Airway Management Planned:   Additional Equipment:   Intra-op Plan:   Post-operative Plan:   Informed Consent: I have reviewed the patients History and Physical, chart, labs and discussed the procedure including the risks, benefits and alternatives for the proposed anesthesia with the patient or authorized representative who has indicated his/her understanding and acceptance.     Plan Discussed with:   Anesthesia Plan Comments:         Anesthesia Quick Evaluation

## 2013-07-25 NOTE — MAU Note (Signed)
As pt was getting up putting clothes on to go home, pt states she had another gush of fluid. Dr T[facility] Crawoss called and [facility]

## 2013-07-25 NOTE — Progress Notes (Signed)
Dr Tenny Crawoss notified of pt's VE, FHR pattern, contraction pattern, orders received to discharge pt home. Pt has a follow up appointment in the am

## 2013-07-26 ENCOUNTER — Encounter (HOSPITAL_COMMUNITY): Payer: Self-pay | Admitting: *Deleted

## 2013-07-26 LAB — CBC
HEMATOCRIT: 31.8 % — AB (ref 36.0–46.0)
HEMOGLOBIN: 10.4 g/dL — AB (ref 12.0–15.0)
MCH: 25.5 pg — ABNORMAL LOW (ref 26.0–34.0)
MCHC: 32.7 g/dL (ref 30.0–36.0)
MCV: 77.9 fL — ABNORMAL LOW (ref 78.0–100.0)
Platelets: 161 10*3/uL (ref 150–400)
RBC: 4.08 MIL/uL (ref 3.87–5.11)
RDW: 13.9 % (ref 11.5–15.5)
WBC: 15.7 10*3/uL — AB (ref 4.0–10.5)

## 2013-07-26 LAB — CCBB MATERNAL DONOR DRAW

## 2013-07-26 MED ORDER — SENNOSIDES-DOCUSATE SODIUM 8.6-50 MG PO TABS
2.0000 | ORAL_TABLET | ORAL | Status: DC
  Administered 2013-07-26 – 2013-07-27 (×2): 2 via ORAL
  Filled 2013-07-26 (×2): qty 2

## 2013-07-26 MED ORDER — IBUPROFEN 600 MG PO TABS
600.0000 mg | ORAL_TABLET | Freq: Four times a day (QID) | ORAL | Status: DC
  Administered 2013-07-26 – 2013-07-28 (×10): 600 mg via ORAL
  Filled 2013-07-26 (×10): qty 1

## 2013-07-26 MED ORDER — ZOLPIDEM TARTRATE 5 MG PO TABS: 5.0000 mg | ORAL_TABLET | Freq: Every evening | ORAL | Status: DC | PRN

## 2013-07-26 MED ORDER — PRENATAL MULTIVITAMIN CH
1.0000 | ORAL_TABLET | Freq: Every day | ORAL | Status: DC
  Administered 2013-07-26 – 2013-07-28 (×3): 1 via ORAL
  Filled 2013-07-26 (×3): qty 1

## 2013-07-26 MED ORDER — MEASLES, MUMPS & RUBELLA VAC ~~LOC~~ INJ
0.5000 mL | INJECTION | Freq: Once | SUBCUTANEOUS | Status: DC
  Filled 2013-07-26: qty 0.5

## 2013-07-26 MED ORDER — WITCH HAZEL-GLYCERIN EX PADS: 1.0000 "application " | MEDICATED_PAD | CUTANEOUS | Status: DC | PRN

## 2013-07-26 MED ORDER — METHYLERGONOVINE MALEATE 0.2 MG PO TABS: 0.2000 mg | ORAL_TABLET | ORAL | Status: DC | PRN

## 2013-07-26 MED ORDER — DIPHENHYDRAMINE HCL 25 MG PO CAPS: 25.0000 mg | ORAL_CAPSULE | Freq: Four times a day (QID) | ORAL | Status: DC | PRN

## 2013-07-26 MED ORDER — METHYLERGONOVINE MALEATE 0.2 MG/ML IJ SOLN: 0.2000 mg | INTRAMUSCULAR | Status: DC | PRN

## 2013-07-26 MED ORDER — DIBUCAINE 1 % RE OINT: 1.0000 "application " | TOPICAL_OINTMENT | RECTAL | Status: DC | PRN

## 2013-07-26 MED ORDER — SIMETHICONE 80 MG PO CHEW: 80.0000 mg | CHEWABLE_TABLET | ORAL | Status: DC | PRN

## 2013-07-26 MED ORDER — ONDANSETRON HCL 4 MG PO TABS: 4.0000 mg | ORAL_TABLET | ORAL | Status: DC | PRN

## 2013-07-26 MED ORDER — OXYCODONE-ACETAMINOPHEN 5-325 MG PO TABS: 1.0000 | ORAL_TABLET | ORAL | Status: DC | PRN

## 2013-07-26 MED ORDER — TETANUS-DIPHTH-ACELL PERTUSSIS 5-2.5-18.5 LF-MCG/0.5 IM SUSP: 0.5000 mL | Freq: Once | INTRAMUSCULAR | Status: DC

## 2013-07-26 MED ORDER — LANOLIN HYDROUS EX OINT: TOPICAL_OINTMENT | CUTANEOUS | Status: DC | PRN

## 2013-07-26 MED ORDER — BENZOCAINE-MENTHOL 20-0.5 % EX AERO
1.0000 | INHALATION_SPRAY | CUTANEOUS | Status: DC | PRN
Start: 2013-07-26 — End: 2013-07-28

## 2013-07-26 MED ORDER — ONDANSETRON HCL 4 MG/2ML IJ SOLN: 4.0000 mg | INTRAMUSCULAR | Status: DC | PRN

## 2013-07-26 NOTE — Discharge Summary (Signed)
Obstetric Discharge Summary Reason for Admission: onset of labor Prenatal Procedures: none Intrapartum Procedures: spontaneous vaginal delivery Postpartum Procedures: Rubella Ig Complications-Operative and Postpartum: none Hemoglobin  Date Value Ref Range Status  07/26/2013 10.4* 12.0 - 15.0 g/dL Final     DELTA CHECK NOTED     REPEATED TO VERIFY     HCT  Date Value Ref Range Status  07/26/2013 31.8* 36.0 - 46.0 % Final    Discharge Diagnoses: Term Pregnancy-delivered  Discharge Information: Date: 07/26/2013 Activity: pelvic rest Diet: routine Medications: Ibuprofen Condition: stable Instructions: refer to practice specific booklet Discharge to: home Follow-up Information   Follow up with Almon Hercules., MD In 4 weeks.   Specialty:  Obstetrics and Gynecology   Contact information:   [address] [place] [address] [phone]       Newborn Data: Live born female  Birth Weight: 7 lb 6.7 oz (3365 g) APGAR: 5, 8  Home with mother.  Loney Laurence 07/26/2013, 7:57 AM

## 2013-07-26 NOTE — Anesthesia Postprocedure Evaluation (Signed)
  Anesthesia Post-op Note  Patient: Veronica Hines  Procedure(s) Performed: * No procedures listed *  Patient Location: Mother/Baby  Anesthesia Type:Epidural  Level of Consciousness: awake  Airway and Oxygen Therapy: Patient Spontanous Breathing  Post-op Pain: none  Post-op Assessment: Patient's Cardiovascular Status Stable, Respiratory Function Stable, Patent Airway, No signs of Nausea or vomiting, Adequate PO intake, Pain level controlled, No headache, No backache, No residual numbness and No residual motor weakness  Post-op Vital Signs: Reviewed and stable  Last Vitals:  Filed Vitals:   07/26/13 0637  BP: 116/70  Pulse: 98  Temp: 36.8 C  Resp: 16    Complications: No apparent anesthesia complications

## 2013-07-26 NOTE — Progress Notes (Signed)
Patient is eating, ambulating, voiding.  Pain control is good.  Filed Vitals:   07/26/13 0345 07/26/13 0400 07/26/13 0535 07/26/13 0637  BP: 132/83 133/76 118/77 116/70  Pulse: 103 98 106 98  Temp:   99 F (37.2 C) 98.3 F (36.8 C)  TempSrc:   Oral   Resp: $Remo'20 20 20 16  'kIzwt$ Height:      Weight:      SpO2:        Fundus firm Perineum without swelling.  Lab Results  Component Value Date   WBC 15.7* 07/26/2013   HGB 10.4* 07/26/2013   HCT 31.8* 07/26/2013   MCV 77.9* 07/26/2013   PLT 161 07/26/2013    --/--/O POS (05/21 1025)/RNI  A/P Post partum day 0.  Routine care.  Expect d/c routine.  Pt needs MMR.  Veronica Hines

## 2013-07-27 NOTE — Lactation Note (Signed)
This note was copied from the chart of Veronica Deriah Torrez. Lactation Consultation Note    Follow up consult with this mom of a term baby, under double phototherapy, bili at last chekc over 11,  And now 33 hours post partum. Mom was pumping one breast when I walked into the room. i explaind how it was better to pump both breasts at the same time, to increase amount expressed, and save time. Mom had about 5-10 mls from right breast of colostrum. I explained the [facility] loaner program to mom, since she will need a DEP at home, and today is Saturday, and Monday is Memorial day, so [facility] will be closed. Mom will let lactation know iif she wants to loan a DEP on baby's discharge. Mom's information faxed to [facility].  Patient Name: Veronica Hines [id number] Date: 07/27/2013 Reason for consult: Follow-up assessment   Maternal Data    Feeding Feeding Type: Bottle Fed - Formula Length of feed:  (hand expressed lots colostrum, infant fussy)  LATCH Score/Interventions Latch: Too sleepy or reluctant, no latch achieved, no sucking elicited. Intervention(s): Skin to skin Intervention(s): Adjust position;Assist with latch  Audible Swallowing: None Intervention(s): Hand expression Intervention(s): Skin to skin  Type of Nipple: Everted at rest and after stimulation  Comfort (Breast/Nipple): Soft / non-tender     Hold (Positioning): Assistance needed to correctly position infant at breast and maintain latch.  LATCH Score: 5  Lactation Tools Discussed/Used WIC Program: Yes (mom given information on loaning a DEP on discharge. ) Pump Review: Setup, frequency, and cleaning   Consult Status Consult Status: Follow-up Date: 07/28/13 Follow-up type: In-patient    Alfred Levins 07/27/2013, 11:56 AM

## 2013-07-27 NOTE — Progress Notes (Signed)
Patient is eating, ambulating, voiding.  Pain control is good.  Filed Vitals:   07/26/13 0535 07/26/13 0637 07/26/13 1030 07/26/13 1745  BP: 118/77 116/70 117/71 122/76  Pulse: 106 98 106 105  Temp: 99 F (37.2 C) 98.3 F (36.8 C) 97.7 F (36.5 C) 98.5 F (36.9 C)  TempSrc: Oral  Oral Oral  Resp: $Remo'20 16 18 16  'kuUZE$ Height:      Weight:      SpO2:    99%    Fundus firm Perineum without swelling.  Lab Results  Component Value Date   WBC 15.7* 07/26/2013   HGB 10.4* 07/26/2013   HCT 31.8* 07/26/2013   MCV 77.9* 07/26/2013   PLT 161 07/26/2013    --/--/O POS (05/21 1025)/RNI  A/P Post partum day 1.  Routine care.  Expect d/c routine.  Pt got MMR.  Daria Pastures

## 2013-07-28 NOTE — Progress Notes (Signed)
Patient is eating, ambulating, voiding.  Pain control is good.  Filed Vitals:   07/26/13 1745 07/27/13 0655 07/27/13 1800 07/28/13 0605  BP: 122/76 127/73 118/75 115/75  Pulse: 105 92 89 76  Temp: 98.5 F (36.9 C) 97.4 F (36.3 C) 98.1 F (36.7 C) 98.1 F (36.7 C)  TempSrc: Oral Oral Oral Oral  Resp: 16 17 18 18  Height:      Weight:      SpO2: 99% 100%  100%    Fundus firm Perineum without swelling.  Lab Results  Component Value Date   WBC 15.7* 07/26/2013   HGB 10.4* 07/26/2013   HCT 31.8* 07/26/2013   MCV 77.9* 07/26/2013   PLT 161 07/26/2013    --/--/O POS (05/21 1025)/RNI  A/P Post partum day 2.  Routine care.  Expect d/c today.  MMR given.   A    

## 2013-07-28 NOTE — Lactation Note (Signed)
This note was copied from the chart of Veronica Hines. Lactation Consultation Note   Follow up consult with this mom and baby, now 55 hours post partum, and no longer needing phototherapy for breast feeding. Mom's milk is transitioing in, and she has been exclusively breast feeding for last 10 hours. i assisted mom with applying 24 nipple shield, and latchg baby in football hold. Audible swallows heard. Mom will need more assistance later today with applying shield, and latching. Mom  Will call when baby feeds next.  Patient Name: Veronica Hines [id number] Date: 07/28/2013 Reason for consult: Follow-up assessment   Maternal Data    Feeding Feeding Type: Breast Fed Length of feed: 1 min  LATCH Score/Interventions Latch: Repeated attempts needed to sustain latch, nipple held in mouth throughout feeding, stimulation needed to elicit sucking reflex. (latched well with 24 nipple shiled) Intervention(s): Skin to skin;Teach feeding cues Intervention(s): Adjust position;Assist with latch;Breast compression  Audible Swallowing: Spontaneous and intermittent  Type of Nipple: Flat Intervention(s): Double electric pump;Hand pump  Comfort (Breast/Nipple): Soft / non-tender     Hold (Positioning): Assistance needed to correctly position infant at breast and maintain latch. Intervention(s): Support Pillows;Position options;Skin to skin;Breastfeeding basics reviewed  LATCH Score: 7  Lactation Tools Discussed/Used Nipple shield size: 24 WIC Program:  (mom hoping to go home breast gfeedingm so will not need DEP)   Consult Status Consult Status: Follow-up Date: 07/29/13 Follow-up type: In-patient    Alfred Levins 07/28/2013, 10:20 AM

## 2013-07-29 ENCOUNTER — Ambulatory Visit: Payer: Self-pay

## 2013-07-29 NOTE — Lactation Note (Signed)
This note was copied from the chart of Veronica Nanami Haegele. Lactation Consultation Note  Patient Name: Veronica Hines [id number] Date: 07/29/2013 Reason for consult: Follow-up assessment;Breast/nipple pain;Difficult latch Mom's milk is in. She stopped putting baby to breast yesterday due to nipple pain. Mom reports she would like to try baby at the breast today. Attempted to latch without the nipple shield, but Mom has flat nipples and baby could not latch. Used #24 nipple shield to latch baby, demonstrated how to bring bottom and upper lip out to be well flanged. After few minutes, baby developed a good suckling pattern with swallows audible. Breast milk present in the nipple shield. Tried #20 nipple shield for better fit however this size would not stay on Mom's breast. Mom has DEBP at home. Stressed importance of either BF or pumping every 2-3 hours to protect milk supply, prevent engorgement. Engorgement care reviewed if needed. Encouraged Mom to BF with each feeding to re-train baby to BF. Advised to BF for 15-30 minutes, both breasts when possible. Pump and supplement as needed unless baby satiated at the breast and continues to have adequate voids/stools. Encouraged OP lactation follow up, Mom will call if decides to schedule appointment. Advised of support group. Advised Mom to refer to Baby N Me booklet, pages 24-25 for chart on void/stools, engorgement care and pump/storage guidelines.   Maternal Data    Feeding Feeding Type: Breast Fed  LATCH Score/Interventions Latch: Grasps breast easily, tongue down, lips flanged, rhythmical sucking. (using #24 nipple shield) Intervention(s): Adjust position;Assist with latch;Breast massage  Audible Swallowing: Spontaneous and intermittent  Type of Nipple: Flat Intervention(s): Double electric pump  Comfort (Breast/Nipple): Filling, red/small blisters or bruises, mild/mod discomfort  Problem noted: Filling  Hold (Positioning): Assistance  needed to correctly position infant at breast and maintain latch. (bringing lips out to be well flanged)  LATCH Score: 7  Lactation Tools Discussed/Used Tools: Nipple Dorris Carnes;Pump Nipple shield size: 24 Breast pump type: Double-Electric Breast Pump   Consult Status Consult Status: Complete Date: 07/29/13 Follow-up type: In-patient    Veronica Hines 07/29/2013, 9:34 AM

## 2014-01-06 ENCOUNTER — Encounter (HOSPITAL_COMMUNITY): Payer: Self-pay | Admitting: *Deleted

## 2014-11-08 IMAGING — US US OB TRANSVAGINAL
1 series · 14 of 28 positions shown · non-contrast
Comparison: 11/17/2012

CLINICAL DATA: Abdominal pain. Pregnancy with inconclusive
viability. 5 week 5 day gestational age by LMP.

EXAM:
TRANSVAGINAL OB ULTRASOUND
TECHNIQUE: Transvaginal ultrasound was performed for complete evaluation of the
gestation as well as the maternal uterus, adnexal regions, and
pelvic cul-de-sac.

[Series 1: us ob transvaginal · 14 of 45 slices shown]
[im 2/45]
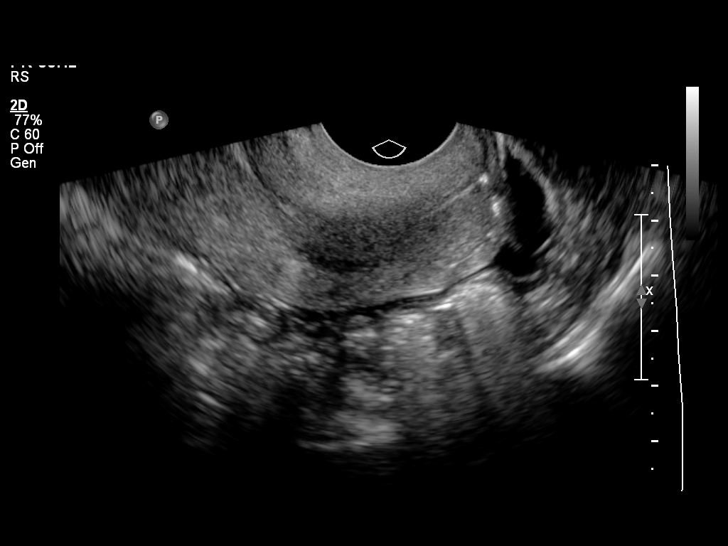
[im 5/45]
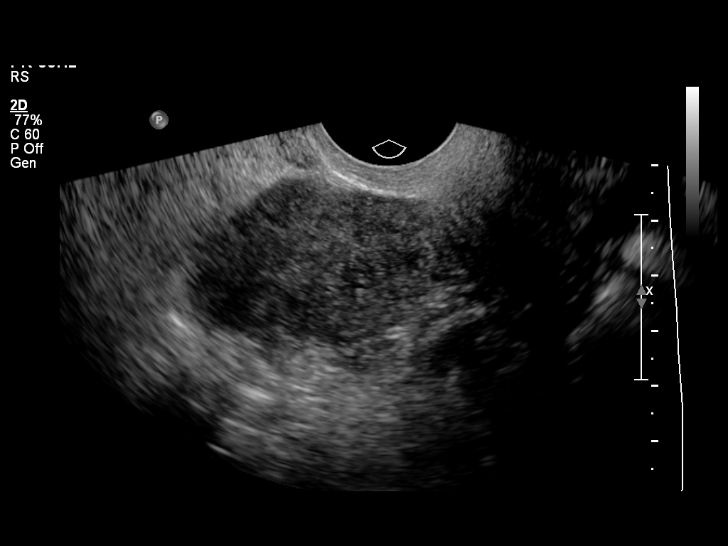
[im 9/45]
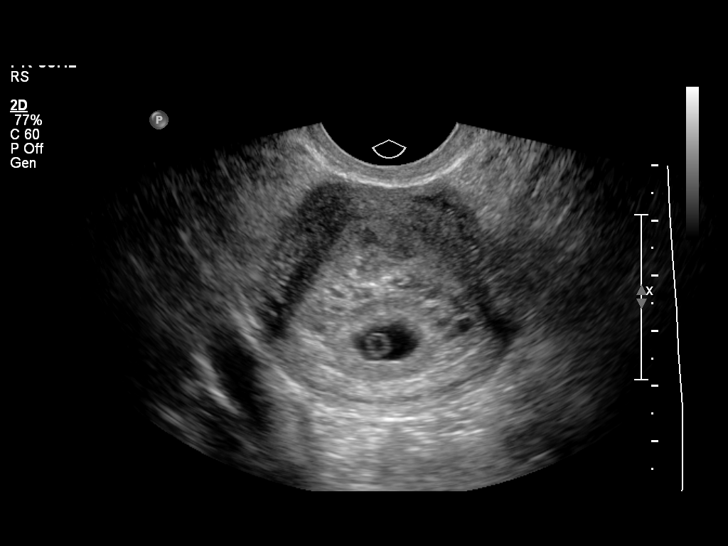
[im 12/45]
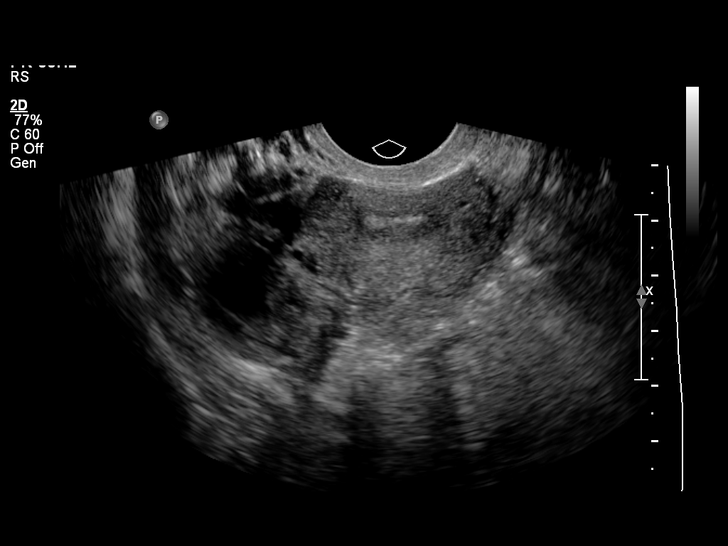
[im 15/45]
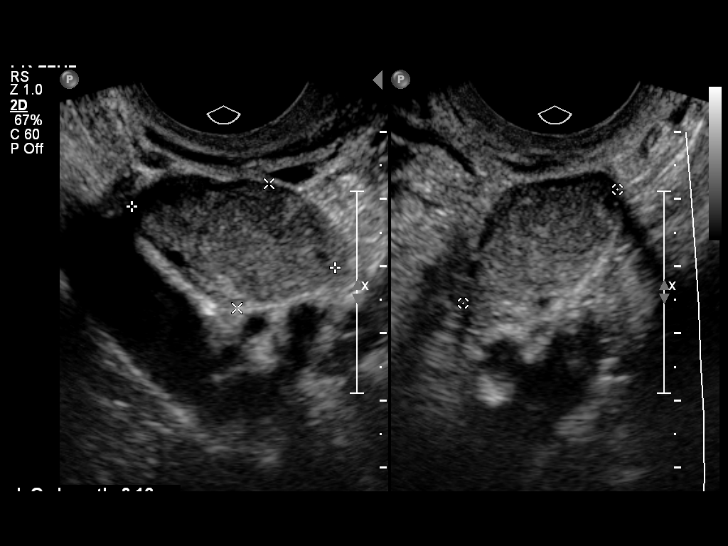
[im 18/45]
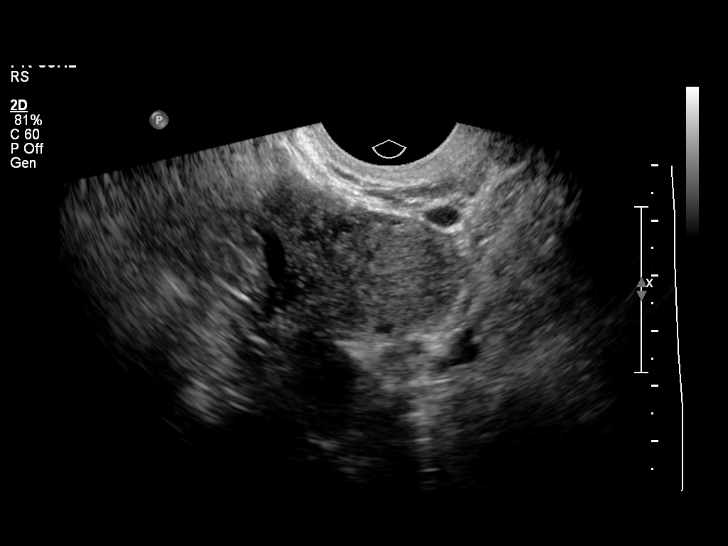
[im 22/45]
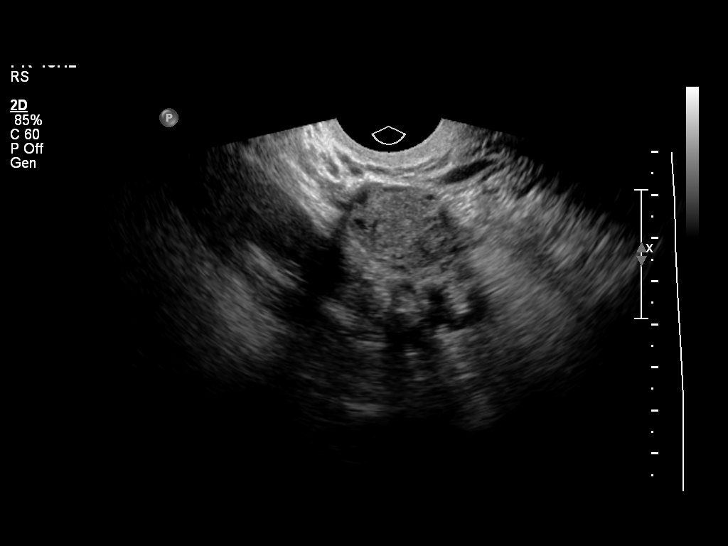
[im 25/45]
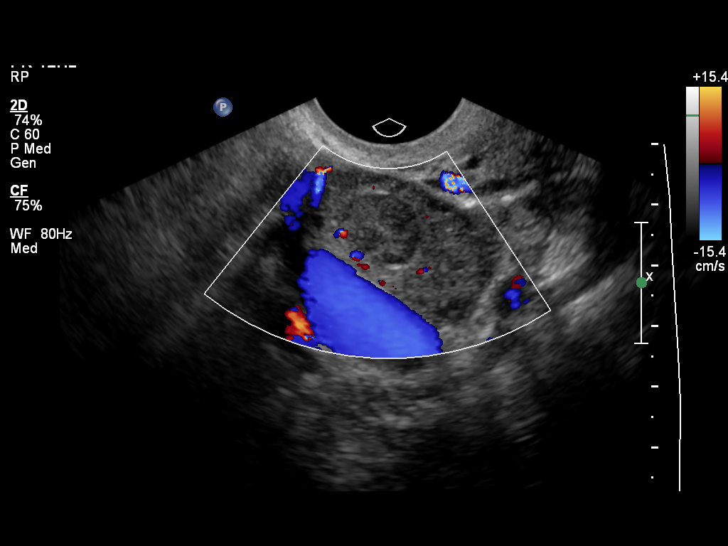
[im 28/45]
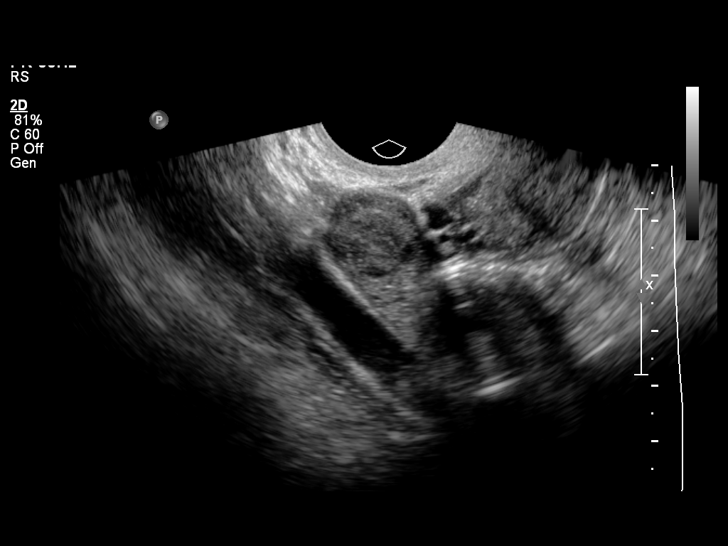
[im 31/45]
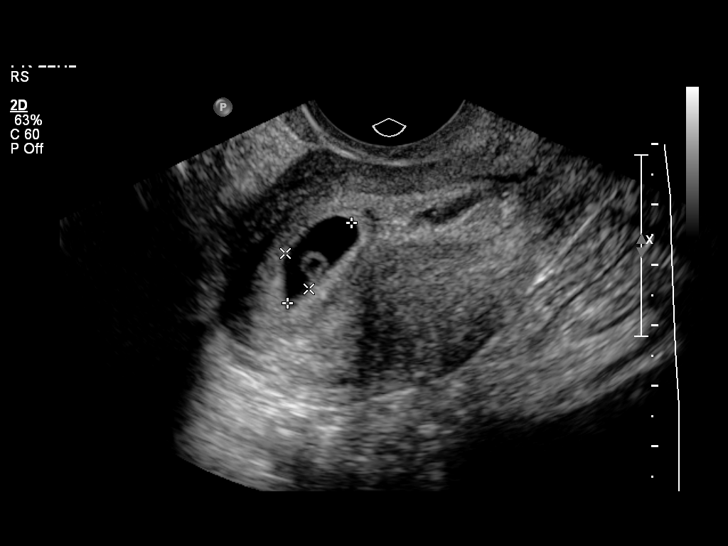
[im 35/45]
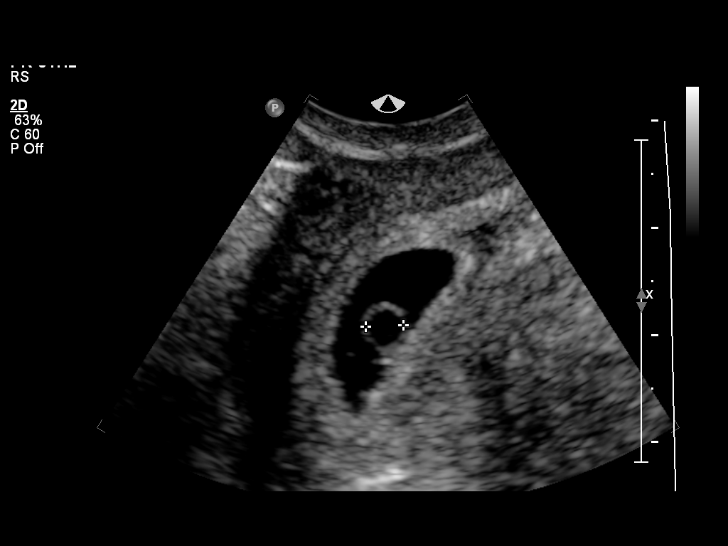
[im 38/45]
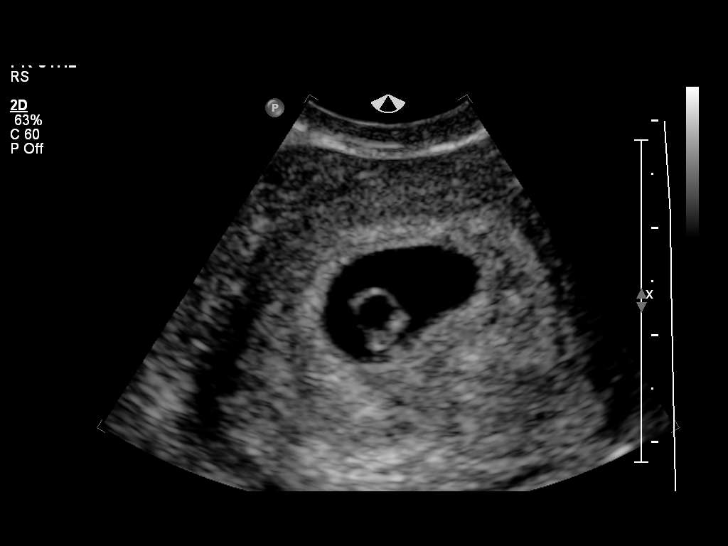
[im 41/45]
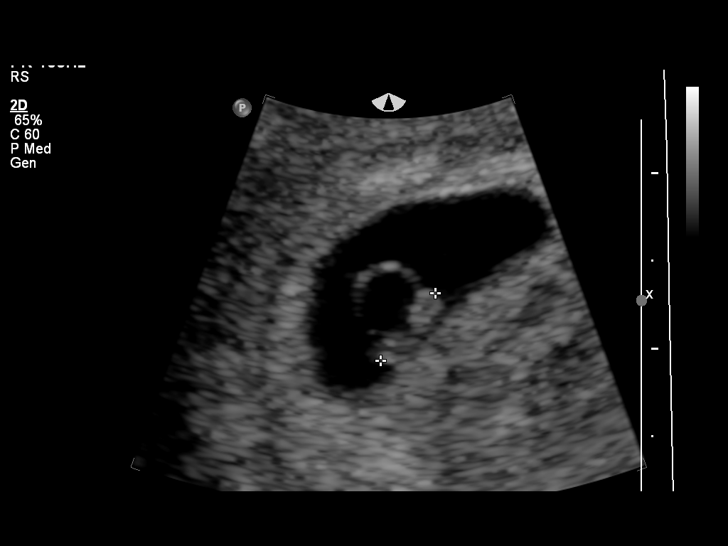
[im 45/45]
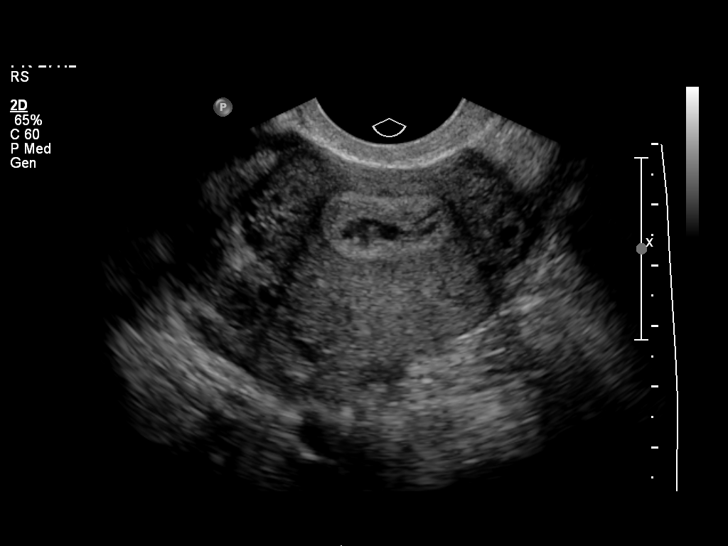

[14 of 28 positions shown; findings below may reference images not displayed]

FINDINGS: Intrauterine gestational sac: Visualized/normal in shape.

Yolk sac:  Visualized

Embryo:  Visualized

Cardiac Activity: Visualized

Heart Rate: 114 bpm

CRL:   5  mm   6 w 2 d                  US EDC: 07/21/2013

Maternal uterus/adnexae: No adnexal mass identified. Trace amount of
free fluid noted. Small right ovarian corpus luteum noted.
IMPRESSION: Single living IUP measuring 6 weeks 2 days with U/S EDC of
07/21/2013. This is concordant with LMP.

No significant maternal uterine or adnexal abnormality identified.

## 2014-12-04 IMAGING — US US OB TRANSVAGINAL
1 series · 14 of 18 positions shown · non-contrast
Comparison: 11/27/2012

CLINICAL DATA: Abdominal/pelvic cramping. Pregnant.

EXAM:
TRANSVAGINAL OB ULTRASOUND
TECHNIQUE: Transvaginal ultrasound was performed for complete evaluation of the
gestation as well as the maternal uterus, adnexal regions, and
pelvic cul-de-sac.

[Series 1: us ob transvaginal · 14 of 18 slices shown]
[im 1/18]
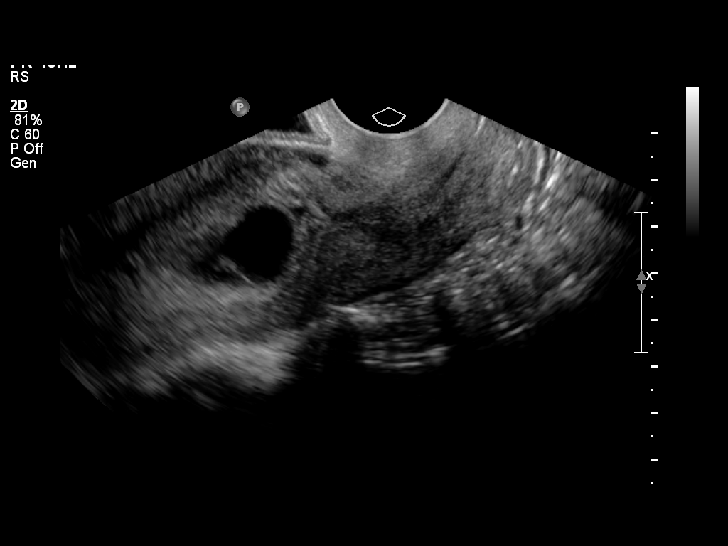
[im 2/18]
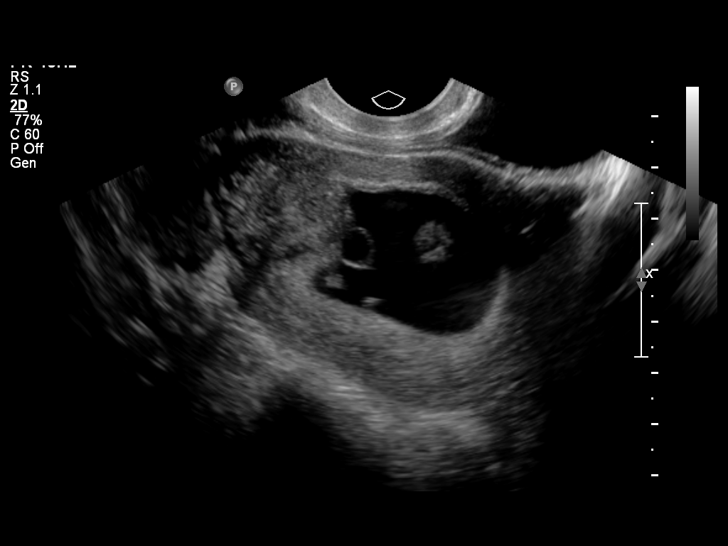
[im 4/18]
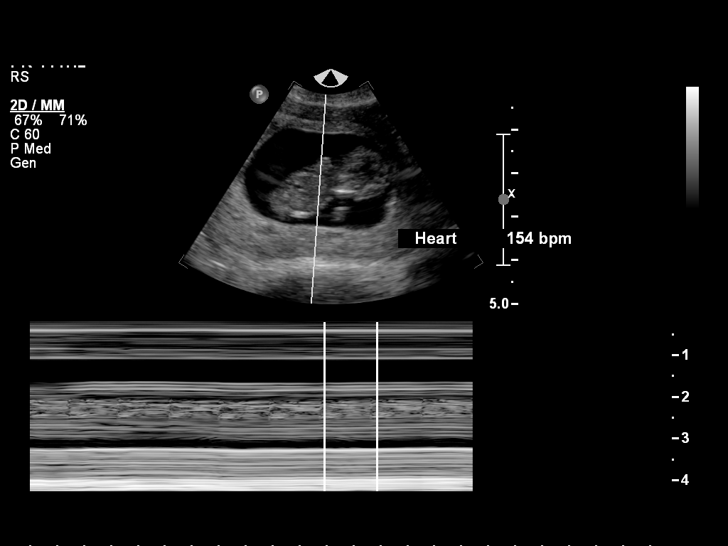
[im 5/18]
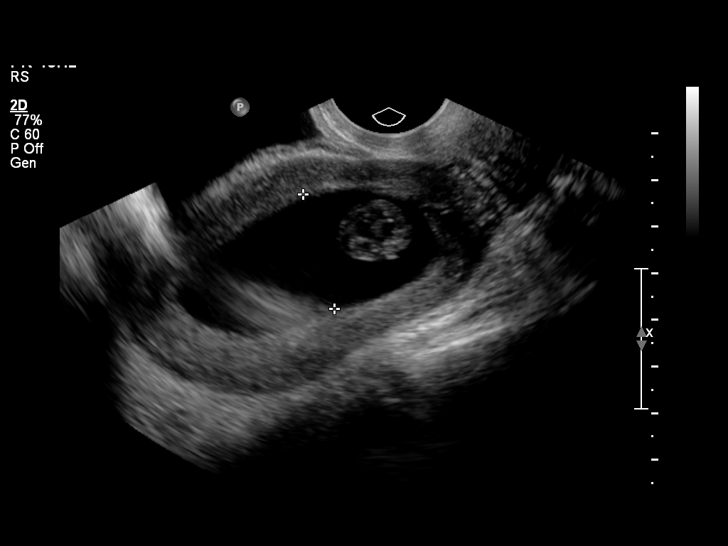
[im 6/18]
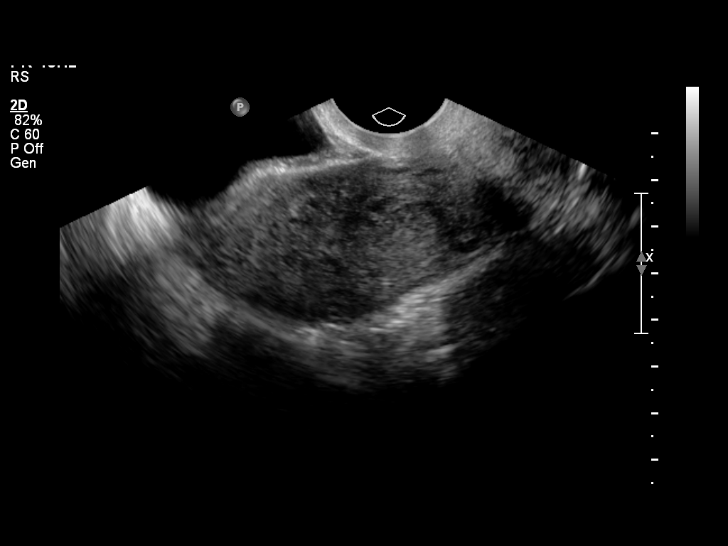
[im 8/18]
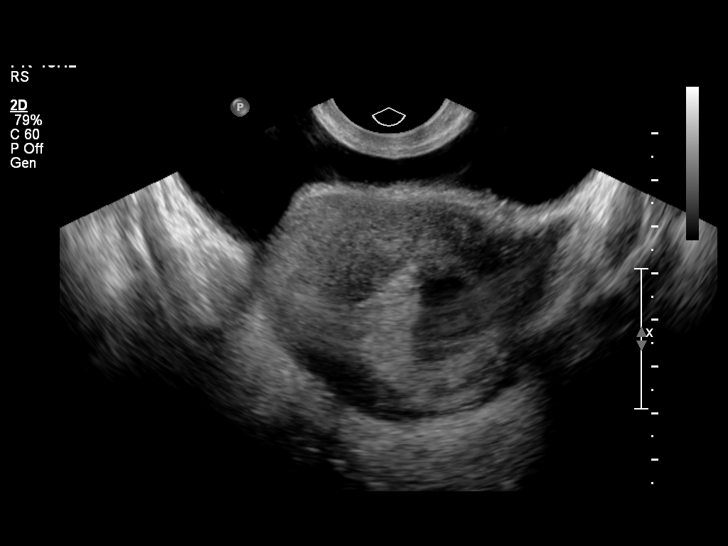
[im 9/18]
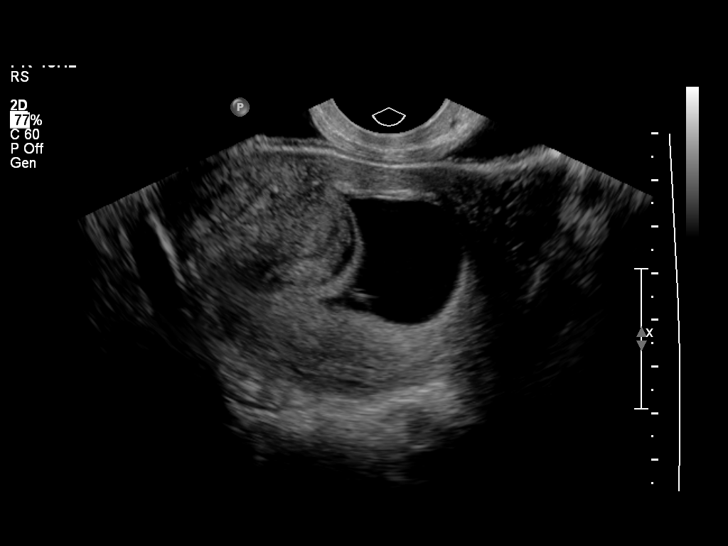
[im 10/18]
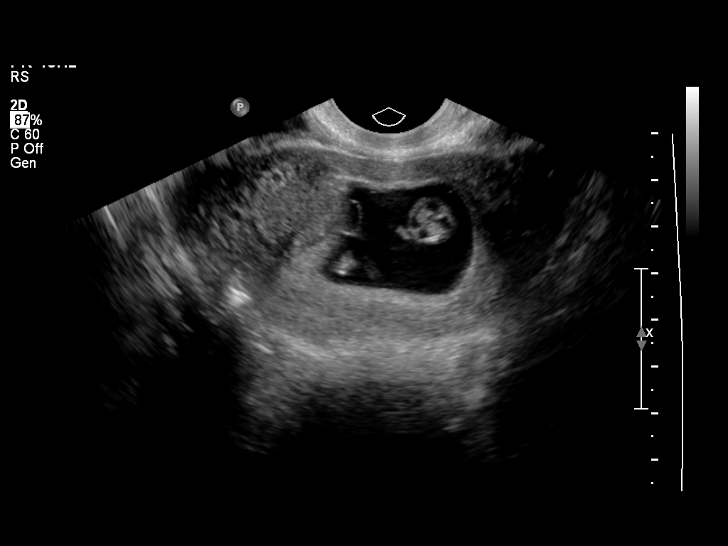
[im 11/18]
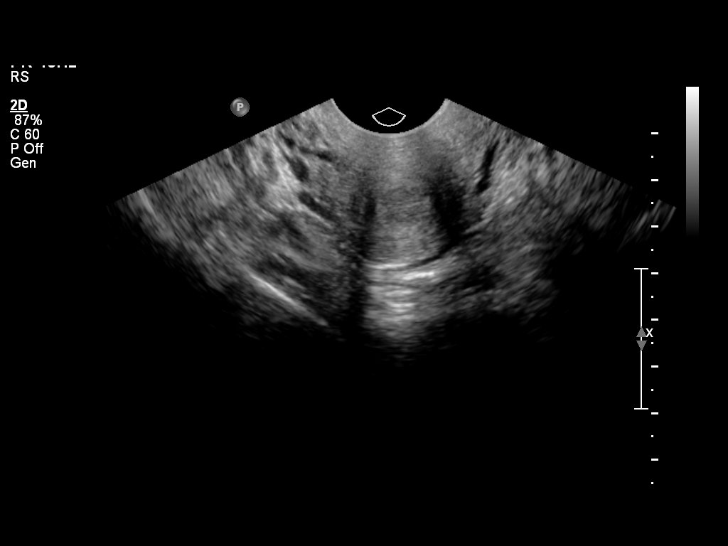
[im 13/18]
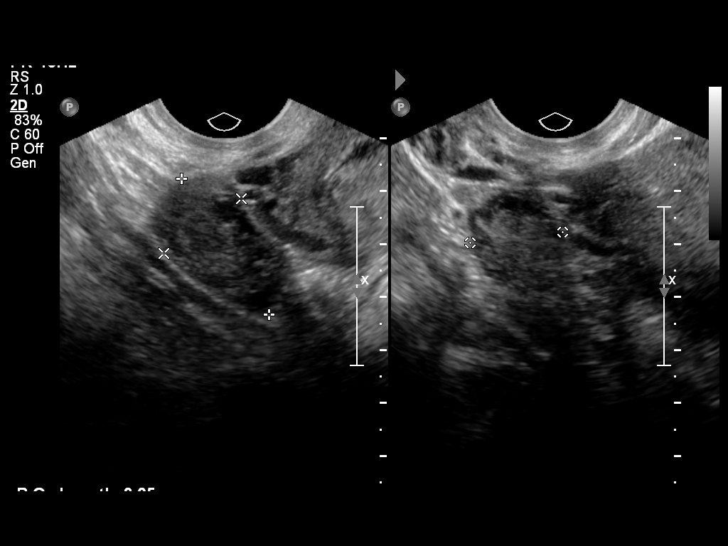
[im 14/18]
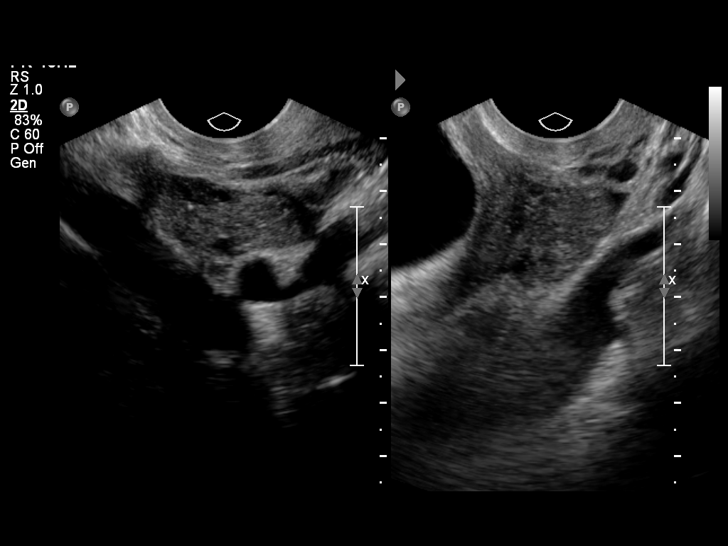
[im 15/18]
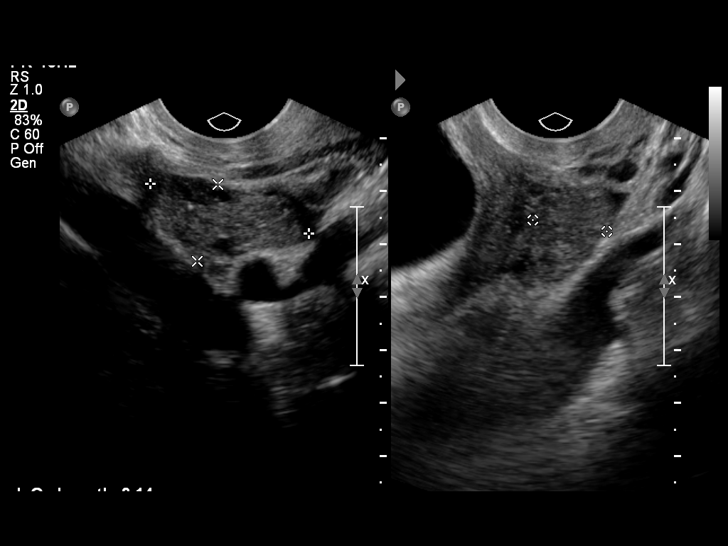
[im 17/18]
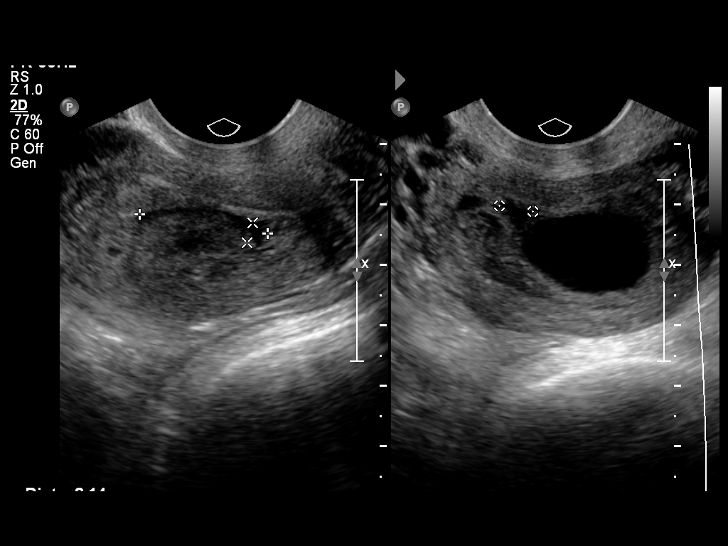
[im 18/18]
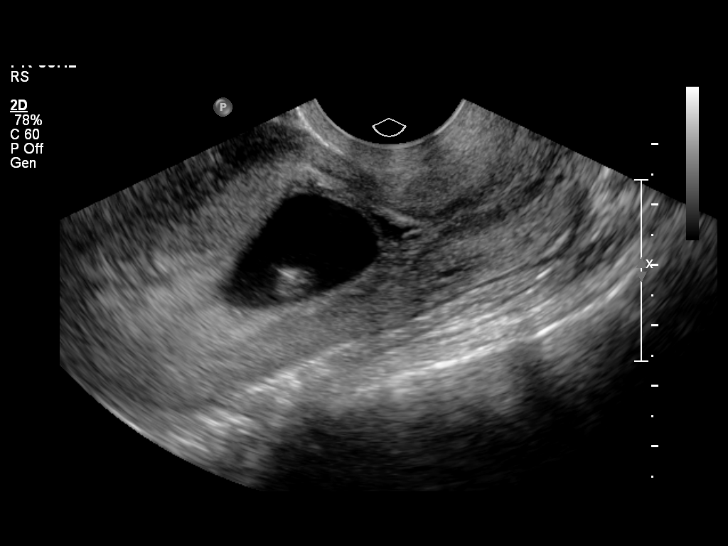

[14 of 18 positions shown; findings below may reference images not displayed]

FINDINGS: Intrauterine gestational sac: Visualized/normal in shape.

Yolk sac:  Yes

Embryo:  Well-formed embryo seen

Cardiac Activity: Visualized

Heart Rate: 154 bpm

MSD:   mm    w     d

CRL:   28.4  mm   9 w 5 d                  US EDC: 07/25/2013

Maternal uterus/adnexae: Minimal subchorionic hemorrhage is evident
along the margin of the gestational sac.

Normal ovaries. No adnexal masses. No free fluid.
IMPRESSION: 1. Single live intrauterine pregnancy. There has been normal
expected growth since the prior ultrasound.
2. Minimal subchorionic hemorrhage is evident. No other pregnancy
complication.
3. Normal ovaries and adnexa.

## 2015-06-27 IMAGING — US US FETAL BPP W/O NONSTRESS
1 series · 13 of 16 positions shown · non-contrast
Comparison: none

[Series 1: us fetal bpp w/o nonstress · non-contrast · 16 acquisitions, 13 frames shown]
[im 1/16]
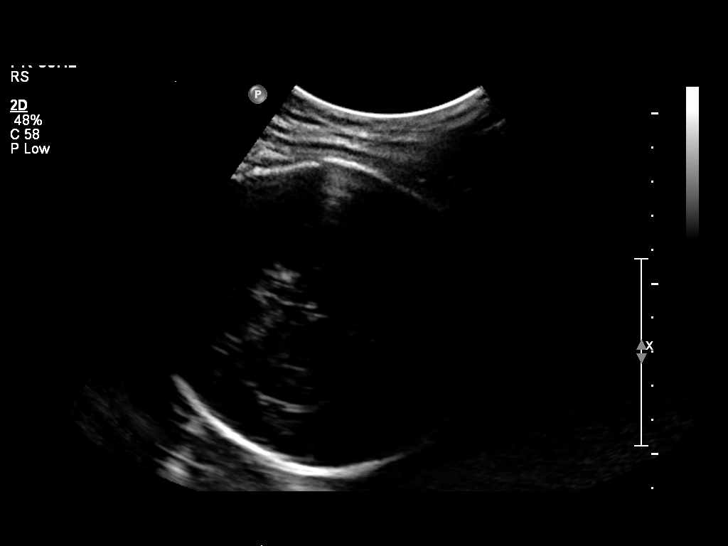
[im 2/16]
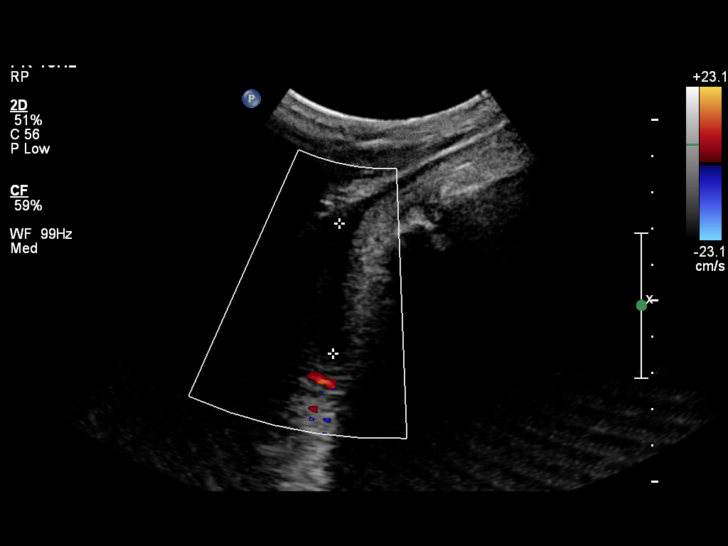
[im 4/16]
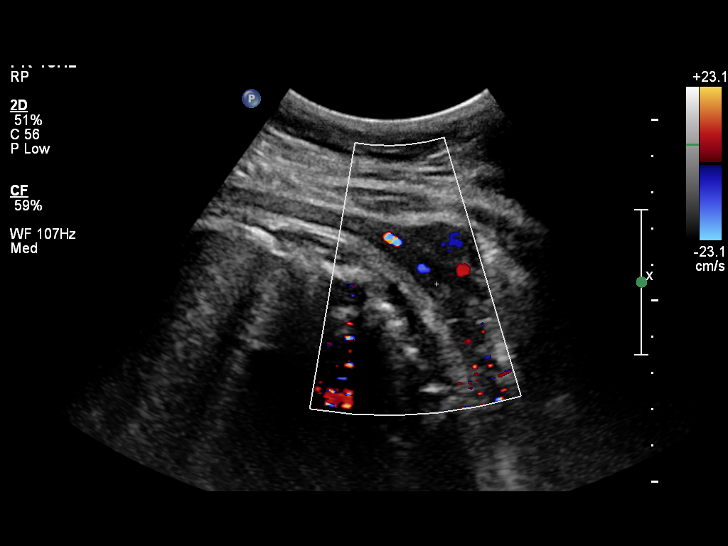
[im 5/16]
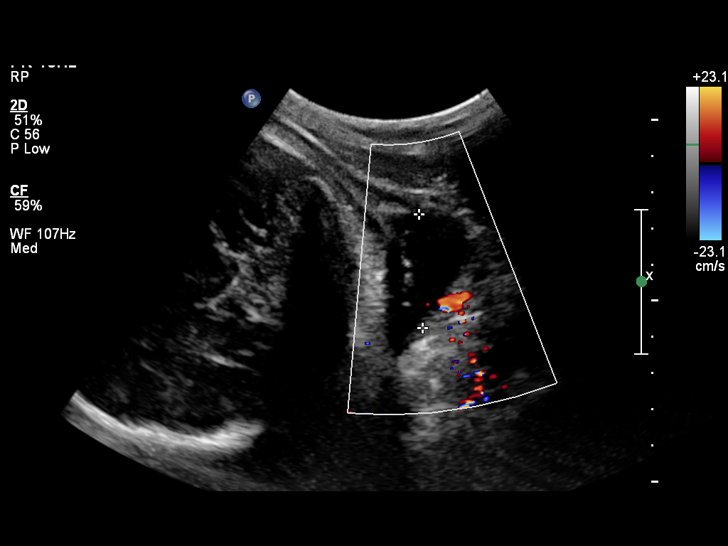
[im 6/16]
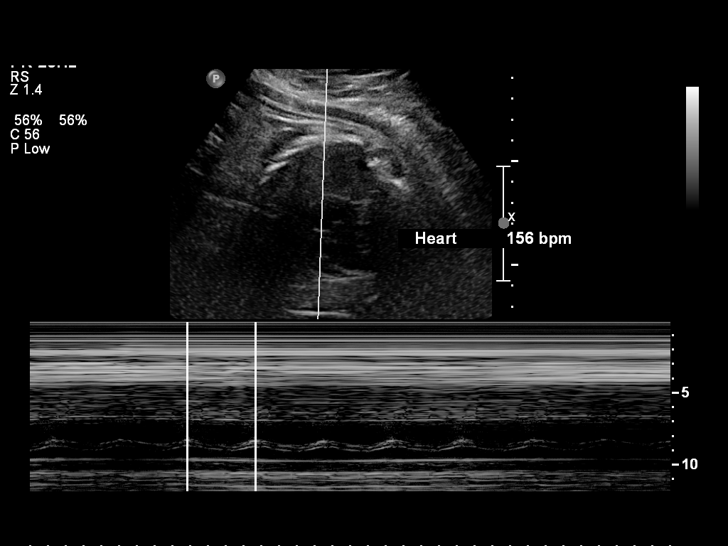
[im 7/16]
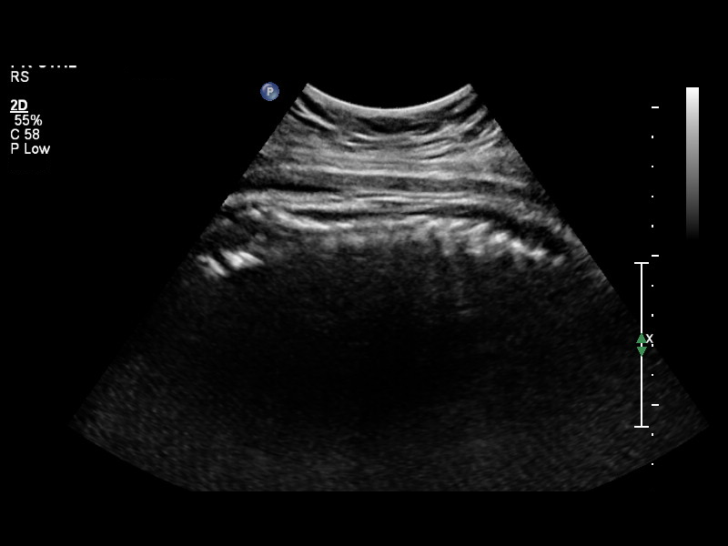
[im 9/16]
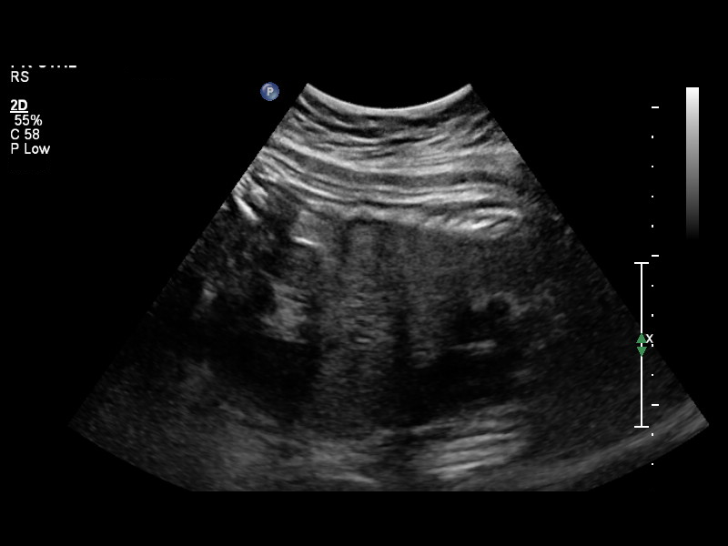
[im 10/16]
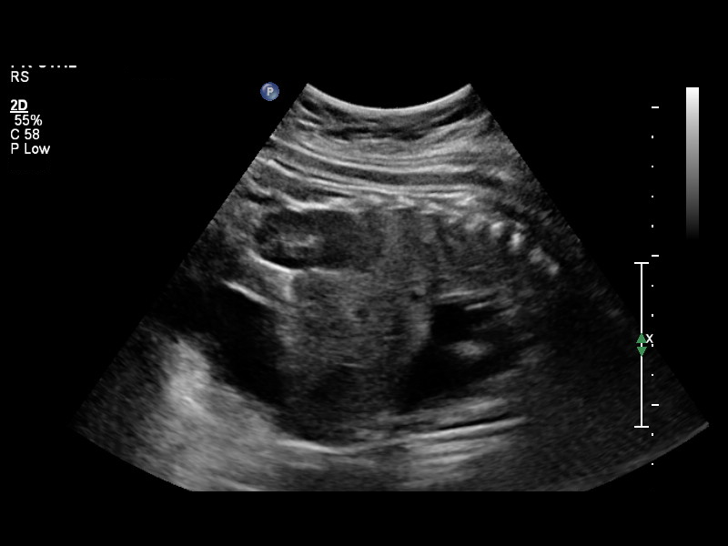
[im 11/16]
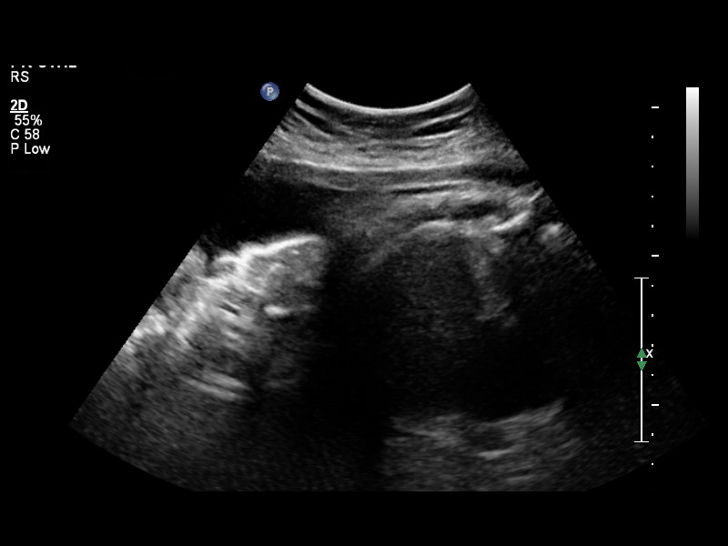
[im 12/16]
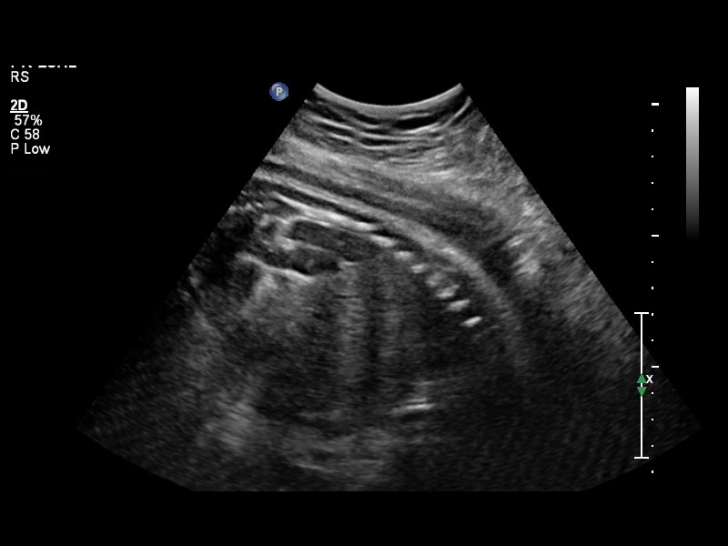
[im 13/16]
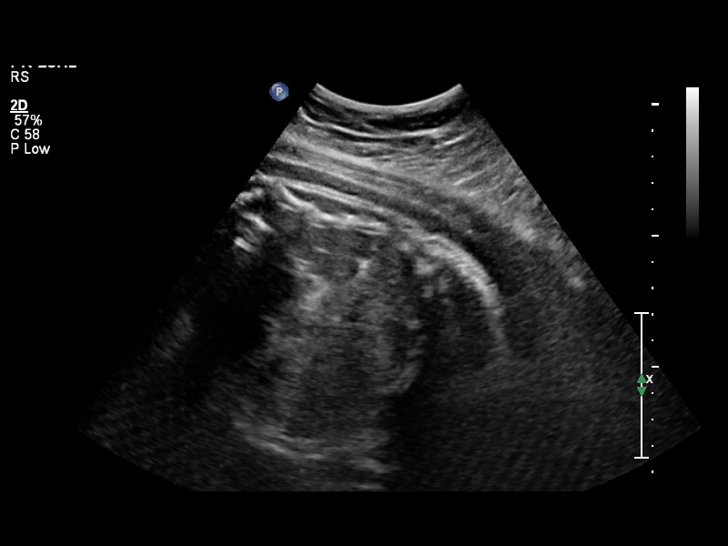
[im 15/16]
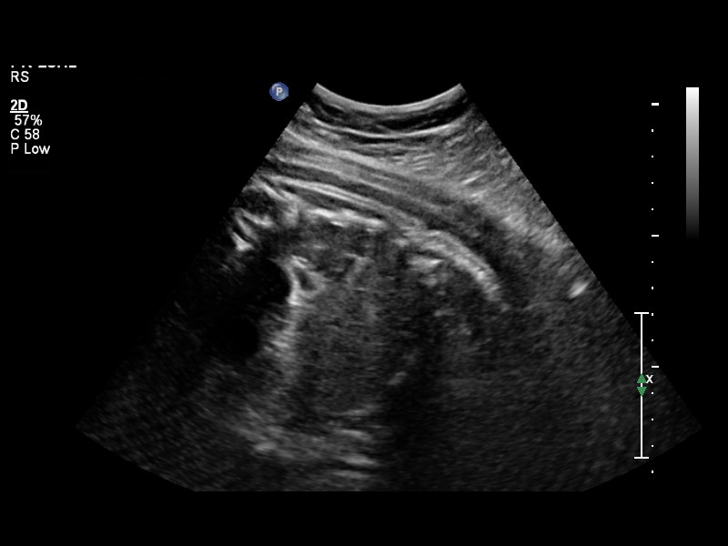
[im 16/16]
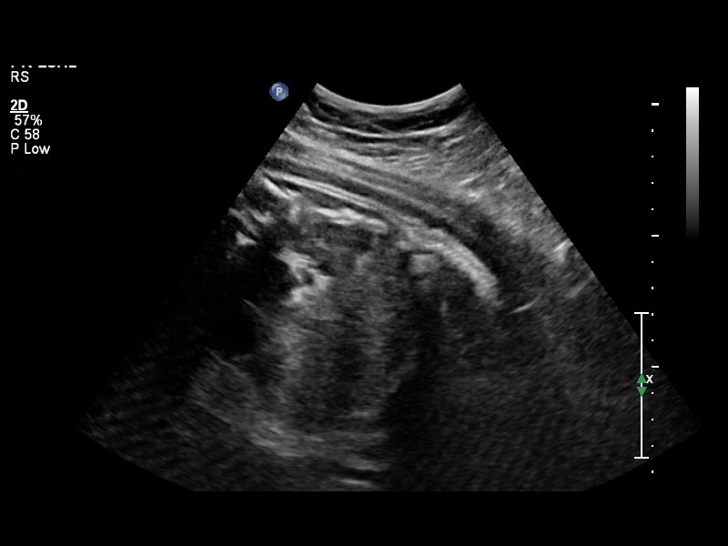

[13 of 16 positions shown; findings below may reference images not displayed]

OBSTETRICS REPORT
                      (Signed Final 07/18/2013 [DATE])

Service(s) Provided

Indications

 Abnormal fetal heart rate/rhythm
Fetal Evaluation

 Num Of Fetuses:    1
 Fetal Heart Rate:  156                          bpm
 Cardiac Activity:  Observed
 Presentation:      Cephalic

 Comment:    Bpp [DATE] in 22 minutes

 Amniotic Fluid
 AFI FV:      Subjectively within normal limits
 AFI Sum:     9.65    cm       24  %Tile     Larg Pckt:    3.58  cm
 RUQ:   2.93    cm   RLQ:    3.58   cm    LUQ:   0.02    cm   LLQ:    3.12   cm
Biophysical Evaluation

 Amniotic F.V:   Pocket => 2 cm two         F. Tone:        Observed
                 planes
 F. Movement:    Observed                   Score:          [DATE]
 F. Breathing:   Observed
Gestational Age

 LMP:           38w 5d        Date:  10/18/12                 EDD:   07/25/13
 Best:          38w 5d     Det. By:  LMP  (10/18/12)          EDD:   07/25/13
Cervix Uterus Adnexa

 Cervix:       Not visualized (advanced GA >34 wks)
Impression

 Single living intrauterine pregnancy at 38 weeks 5 days.
 Normal amniotic fluid volume.
 BPP [DATE].
Recommendations

 Follow-up ultrasounds as clinically indicated.
 questions or concerns.
                Jumper, Klever

## 2016-08-03 ENCOUNTER — Encounter: Payer: Self-pay | Admitting: Certified Nurse Midwife

## 2016-08-03 ENCOUNTER — Ambulatory Visit: Payer: Medicaid Other | Admitting: Certified Nurse Midwife

## 2016-08-04 NOTE — Progress Notes (Signed)
Wrong patient scheduled, same name patient was scheduled for NOB visit.

## 2016-11-10 ENCOUNTER — Encounter (HOSPITAL_COMMUNITY): Payer: Self-pay | Admitting: Family Medicine

## 2016-11-10 ENCOUNTER — Ambulatory Visit (HOSPITAL_COMMUNITY)
Admission: EM | Admit: 2016-11-10 | Discharge: 2016-11-10 | Disposition: A | Discharge status: Home / Self Care | Payer: BLUE CROSS/BLUE SHIELD | Attending: Internal Medicine | Admitting: Internal Medicine

## 2016-11-10 DIAGNOSIS — R51 Headache: Secondary | ICD-10-CM

## 2016-11-10 DIAGNOSIS — M5441 Lumbago with sciatica, right side: Secondary | ICD-10-CM

## 2016-11-10 MED ORDER — METHYLPREDNISOLONE SODIUM SUCC 125 MG IJ SOLR
INTRAMUSCULAR | Status: AC
  Filled 2016-11-10: qty 2

## 2016-11-10 MED ORDER — NAPROXEN 500 MG PO TABS: 500.0000 mg | ORAL_TABLET | Freq: Two times a day (BID) | ORAL | 0 refills | Status: DC | PRN

## 2016-11-10 MED ORDER — METHYLPREDNISOLONE SODIUM SUCC 125 MG IJ SOLR
125.0000 mg | Freq: Once | INTRAMUSCULAR | Status: AC
  Administered 2016-11-10: 125 mg via INTRAMUSCULAR

## 2016-11-10 NOTE — ED Provider Notes (Signed)
[facility]    CSN: [id number] Arrival date & time: 11/10/16  1048     History   Chief Complaint Chief Complaint  Patient presents with  . Back Pain    HPI Veronica Hines is a 26 y.o. female.   26 year old female presents with central lower back pain that radiates down her right leg. She has had this pain since her epidural was placed for childbirth in May 2015. She usually only has pain in this area the day before and the day of her period and then it resolves. This time she had her period in the beginning of August and the pain has continued the entire month including through next period on 11/03/16. The pain is getting worse with more radiation of pain. She denies any numbness. She denies any change in flow of her menstrual cycle and no dysuria, unusual vaginal discharge or bleeding. She has taken Tylenol and Ibuprofen with no relief. Other chronic health issues include GERD in which she takes Prilosec and Migraine headaches.    The history is provided by the patient.    Past Medical History:  Diagnosis Date  . Anemia   . Chlamydia   . GERD (gastroesophageal reflux disease)   . Migraine   . Ovarian cyst     Patient Active Problem List   Diagnosis Date Noted  . Normal delivery 07/26/2013  . Amniotic fluid leaking 07/25/2013  . Trichomonal vaginitis 01/29/2013  . Supervision of normal first pregnancy in first trimester 12/25/2012  . Anemia 12/25/2012  . Abdominal pain, left lower quadrant 07/03/2012  . Migraine 03/05/2012  . Family planning counseling 03/05/2012  . Heart burn 02/27/2012    History reviewed. No pertinent surgical history.  OB History    Gravida Para Term Preterm AB Living   SAB TAB Ectopic Multiple Live Births           1       Home Medications    Prior to Admission medications   Medication Sig Start Date End Date Taking? Authorizing Provider  acetaminophen (TYLENOL) 325 MG tablet Take by mouth every 6 (six) hours  as needed for headache.    [provider]  naproxen (NAPROSYN) 500 MG tablet Take 1 tablet (500 mg total) by mouth 2 (two) times daily as needed for moderate pain. 11/10/16   Sudie Grumbling, NP  Omeprazole (PRILOSEC PO) Take 1 tablet by mouth daily as needed (heartburn).    [provider]  Prenatal Vit-Min-FA-Fish Oil ([facility] PRENATAL GUMMY PO) Take 2 tablets by mouth daily.    [provider]    Family History Family History  Problem Relation Age of Onset  . Diabetes Maternal Grandmother   . Cancer Maternal Grandfather        lung  . Depression Mother   . Hypertension Mother   . Anxiety disorder Mother   . Bipolar disorder Mother   . Bipolar disorder Father   . Hypertension Maternal Aunt   . Hypertension Maternal Uncle   . Diabetes Maternal Uncle   . Hypertension Paternal Aunt   . Diabetes Paternal Aunt   . Hypertension Paternal Uncle   . Diabetes Paternal Uncle     Social History Social History  Substance Use Topics  . Smoking status: Former Smoker    Types: Cigarettes, Cigars    Quit date: 07/09/2011  . Smokeless tobacco: Never Used  . Alcohol use No  Allergies   Patient has no known allergies.   Review of Systems Review of Systems  Constitutional: Negative for appetite change, chills, fatigue, fever and unexpected weight change.  Respiratory: Negative for cough, chest tightness, shortness of breath and wheezing.   Cardiovascular: Negative for chest pain and leg swelling.  Gastrointestinal: Negative for abdominal pain, anal bleeding, blood in stool, constipation, diarrhea, nausea and vomiting.  Genitourinary: Negative for decreased urine volume, difficulty urinating, dyspareunia, dysuria, flank pain, frequency, genital sores, hematuria, menstrual problem, pelvic pain, urgency, vaginal bleeding, vaginal discharge and vaginal pain.  Musculoskeletal: Positive for back pain. Negative for arthralgias, myalgias and neck pain.  Skin: Negative  for rash and wound.  Neurological: Positive for headaches. Negative for dizziness, tremors, seizures, syncope, weakness, light-headedness and numbness.  Hematological: Negative for adenopathy. Does not bruise/bleed easily.  Psychiatric/Behavioral: Negative.      Physical Exam Triage Vital Signs ED Triage Vitals  Enc Vitals Group     BP 11/10/16 1129 128/81     Pulse Rate 11/10/16 1129 98     Resp 11/10/16 1129 18     Temp 11/10/16 1129 98.6 F (37 C)     Temp Source 11/10/16 1129 Oral     SpO2 11/10/16 1129 100 %     Weight --      Height --      Head Circumference --      Peak Flow --      Pain Score 11/10/16 1128 7     Pain Loc --      Pain Edu? --      Excl. in GC? --    No data found.   Updated Vital Signs BP 128/81   Pulse 98   Temp 98.6 F (37 C) (Oral)   Resp 18   LMP 11/03/2016   SpO2 100%   Visual Acuity Right Eye Distance:   Left Eye Distance:   Bilateral Distance:    Right Eye Near:   Left Eye Near:    Bilateral Near:     Physical Exam  Constitutional: She is oriented to person, place, and time. She appears well-developed and well-nourished. No distress.  Patient sitting comfortably on exam table  HENT:  Head: Normocephalic and atraumatic.  Mouth/Throat: Oropharynx is clear and moist.  Eyes: Conjunctivae and EOM are normal.  Neck: Normal range of motion. Neck supple.  Cardiovascular: Normal rate, regular rhythm and normal heart sounds.   No murmur heard. Pulmonary/Chest: Effort normal and breath sounds normal. No respiratory distress.  Abdominal: Soft. Normal appearance and bowel sounds are normal. There is no tenderness. There is no rigidity, no rebound, no guarding and no CVA tenderness.  Musculoskeletal: Normal range of motion. She exhibits tenderness.       Lumbar back: She exhibits tenderness and pain. She exhibits normal range of motion, no swelling, no edema, no deformity, no laceration, no spasm and normal pulse.       Back:  Has full  range of motion of back but pain with flexion and extension. Slightly tender around L5,S1 area midline. No distinct muscle spasms. SLR positive bilaterally. Reflexes are normal. No neuro deficits noted.   Lymphadenopathy:    She has no cervical adenopathy.  Neurological: She is alert and oriented to person, place, and time. She has normal strength and normal reflexes. No sensory deficit.  Skin: Skin is warm and dry. Capillary refill takes less than 2 seconds. No rash noted.  Psychiatric: She has a normal mood and affect. Her  behavior is normal. Judgment and thought content normal.     UC Treatments / Results  Labs (all labs ordered are listed, but only abnormal results are displayed) Labs Reviewed - No data to display  EKG  EKG Interpretation None       Radiology No results found.  Procedures Procedures (including critical care time)  Medications Ordered in UC Medications  methylPREDNISolone sodium succinate (SOLU-MEDROL) 125 mg/2 mL injection 125 mg (125 mg Intramuscular Given 11/10/16 1314)     Initial Impression / Assessment and Plan / UC Course  I have reviewed the triage vital signs and the nursing notes.  Pertinent labs & imaging results that were available during my care of the patient were reviewed by me and considered in my medical decision making (see chart for details).    Discussed that uncertain of exact cause of back pain. Recommend Solu-Medro  IM now to help with pain and nerve irritation. May take Naproxen  twice a day as directed. Note written for work for today. Recommend follow-up with her OB/GYN since pain started after epidural with childbirth and continues. May need referral from OB/GYN to Neurologist for further evaluation.    Final Clinical Impressions(s) / UC Diagnoses   Final diagnoses:  Acute midline low back pain with right-sided sciatica    New Prescriptions Discharge Medication List as of 11/10/2016  1:08 PM    START taking these  medications   Details  naproxen (NAPROSYN) 500 MG tablet Take 1 tablet (500 mg total) by mouth 2 (two) times daily as needed for moderate pain., Starting Thu 11/10/2016, Normal         Controlled Substance Prescriptions [place] Controlled Substance Registry consulted? No   Sudie Grumbling, NP 11/11/16 1051

## 2016-11-10 NOTE — ED Triage Notes (Signed)
Pt here for mid lower back pain that radiates down right leg. sts since her cycle over a week ago. Denies injury. sts taking tylenol and ibuprofen and no relief.

## 2016-11-10 NOTE — Discharge Instructions (Addendum)
You were given a shot of Prednisone today to help with pain and swelling in the back. Recommend start Naproxen  twice a day as needed for pain. Recommend follow-up with your OB/GYN since pain started after epidural with childbirth and continues. May need referral from OB/GYN to Neurologist for further evaluation.

## 2021-11-23 ENCOUNTER — Ambulatory Visit: Payer: Self-pay

## 2021-12-03 ENCOUNTER — Ambulatory Visit: Payer: BLUE CROSS/BLUE SHIELD | Admitting: Family

## 2021-12-07 ENCOUNTER — Telehealth: Payer: Self-pay

## 2021-12-15 ENCOUNTER — Ambulatory Visit (INDEPENDENT_AMBULATORY_CARE_PROVIDER_SITE_OTHER): Payer: No Typology Code available for payment source | Admitting: Family

## 2021-12-15 ENCOUNTER — Encounter: Payer: Self-pay | Admitting: Family

## 2021-12-15 VITALS — BP 132/94 | HR 106 | Temp 97.9°F | Ht 66.5 in | Wt 243.6 lb

## 2021-12-15 DIAGNOSIS — G43909 Migraine, unspecified, not intractable, without status migrainosus: Secondary | ICD-10-CM | POA: Diagnosis not present

## 2021-12-15 DIAGNOSIS — F411 Generalized anxiety disorder: Secondary | ICD-10-CM

## 2021-12-15 DIAGNOSIS — Z803 Family history of malignant neoplasm of breast: Secondary | ICD-10-CM | POA: Insufficient documentation

## 2021-12-15 NOTE — Assessment & Plan Note (Signed)
   chronic - pt reports sx starting when her mother dx w/breast cancer   stable on Zoloft 50mg  qd  no refill needed today  f/u 6 mos or prn

## 2021-12-15 NOTE — Progress Notes (Signed)
New Patient Office Visit  Subjective:  Patient ID: Veronica Hines, female    DOB: 05/20/1990  Age: 31 y.o. MRN: [id number]  CC:  Chief Complaint  Patient presents with   Establish Care    No concerns     HPI Veronica Hines presents for establishing care today.  Pt reports hx of migraines and anxiety that she takes medications for daily, but does not need refills today. 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  Assessment & Plan:   Problem List Items Addressed This Visit       Cardiovascular and Mediastinum   Migraine - Primary    chronic  pt taking qhs and this has been controlling her sx no refill needed today f/u prn      Relevant Medications   sertraline (ZOLOFT) 50 MG tablet   amitriptyline (ELAVIL) 50 MG tablet     Other   Generalized anxiety disorder    chronic - pt reports sx starting when her mother dx w/breast cancer  stable on Zoloft 50mg  qd no refill needed today f/u 6 mos or prn       Relevant Medications   sertraline (ZOLOFT) 50 MG tablet   amitriptyline (ELAVIL) 50 MG tablet    Subjective:    Outpatient Medications Prior to Visit  Medication Sig Dispense Refill   albuterol (VENTOLIN HFA) 108 (90 Base) MCG/ACT inhaler Inhale 2 puffs into the lungs every 6 (six) hours as needed.     amitriptyline (ELAVIL) 50 MG tablet      clobetasol cream (TEMOVATE) 0.05 % APPLY A THIN LAYER TO THE AFFECTED AREA(S) BY TOPICAL ROUTE 2 TIMES PER DAY for up to 2 weeks     sertraline (ZOLOFT) 50 MG tablet Take 50 mg by mouth daily.     acetaminophen (TYLENOL) 325 MG tablet Take by mouth every 6 (six) hours as needed for headache.     naproxen (NAPROSYN) 500 MG tablet Take 1 tablet (500 mg total) by mouth 2 (two) times daily as needed for moderate pain. 20 tablet 0   Omeprazole (PRILOSEC PO) Take 1 tablet by mouth daily as needed (heartburn).     Prenatal  Vit-Min-FA-Fish Oil ([facility] PRENATAL GUMMY PO) Take 2 tablets by mouth daily.     No facility-administered medications prior to visit.   Past Medical History:  Diagnosis Date   Abdominal pain, left lower quadrant 07/03/2012   Amniotic fluid leaking 07/25/2013   Anemia    Anxiety    Chlamydia    Family planning counseling 03/05/2012   GERD (gastroesophageal reflux disease)    Heart burn 02/27/2012   Migraine    Normal delivery 07/26/2013   Ovarian cyst    Supervision of normal first pregnancy in first trimester 12/25/2012   Trichomonal vaginitis 01/29/2013   History reviewed. No pertinent surgical history.  Objective:   Today's Vitals: BP (!) 132/94 (BP Location: Left Arm, Patient Position: Sitting, Cuff Size: Large)   Pulse (!) 106   Temp 97.9 F (36.6 C) (Temporal)   Ht 5' 6.5" (1.689 m)   Wt 243 lb 9.6 oz (110.5 kg)   LMP 11/23/2021 (Exact Date)   SpO2 96%   Breastfeeding No   BMI 38.73 kg/m   Physical Exam Vitals and nursing note reviewed.  Constitutional:      Appearance: Normal appearance. She is obese.  Cardiovascular:  Rate and Rhythm: Normal rate and regular rhythm.  Pulmonary:     Effort: Pulmonary effort is normal.     Breath sounds: Normal breath sounds.  Musculoskeletal:        General: Normal range of motion.  Skin:    General: Skin is warm and dry.  Neurological:     Mental Status: She is alert.  Psychiatric:        Mood and Affect: Mood normal.        Behavior: Behavior normal.     Jeanie Sewer, NP

## 2021-12-15 NOTE — Patient Instructions (Addendum)
Welcome to [facility], It was a pleasure meeting you today!   Let us know when you need refills.  Please schedule a physical with fasting labs at your convenience!    PLEASE NOTE: If you had any LAB tests please let us know if you have not heard back within a few days. You may see your results on MyChart before we have a chance to review them but we will give you a call once they are reviewed by [place]. If we ordered any REFERRALS today, please let us know if you have not heard from their office within the next week.  Let us know through MyChart if you are needing REFILLS, or have your pharmacy send [place] the request. You can also use MyChart to communicate with me or any office staff.   Please try these tips to maintain a healthy lifestyle:  Eat most of your calories during the day when you are active. Eliminate processed foods including packaged sweets (pies, cakes, cookies), reduce intake of potatoes, white bread, white pasta, and white rice. Look for whole grain options, oat flour or almond flour.  Each meal should contain half fruits/vegetables, one quarter protein, and one quarter carbs (no bigger than a computer mouse).  Cut down on sweet beverages. This includes juice, soda, and sweet tea. Also watch fruit intake, though this is a healthier sweet option, it still contains natural sugar! Limit to 3 servings daily.  Drink at least 1 8oz. glass of water with each meal and aim for at least 8 glasses per day  Exercise at least 150 minutes every week.

## 2021-12-15 NOTE — Assessment & Plan Note (Signed)
   chronic   pt taking qhs and this has been controlling her sx  no refill needed today  f/u prn

## 2022-01-03 LAB — HM PAP SMEAR

## 2022-01-06 ENCOUNTER — Encounter: Payer: Self-pay | Admitting: Family

## 2022-01-06 ENCOUNTER — Ambulatory Visit (INDEPENDENT_AMBULATORY_CARE_PROVIDER_SITE_OTHER): Payer: No Typology Code available for payment source | Admitting: Family

## 2022-01-06 VITALS — BP 117/79 | HR 100 | Temp 97.8°F | Ht 66.5 in | Wt 245.4 lb

## 2022-01-06 DIAGNOSIS — E669 Obesity, unspecified: Secondary | ICD-10-CM

## 2022-01-06 DIAGNOSIS — Z0000 Encounter for general adult medical examination without abnormal findings: Secondary | ICD-10-CM | POA: Diagnosis not present

## 2022-01-06 DIAGNOSIS — G43909 Migraine, unspecified, not intractable, without status migrainosus: Secondary | ICD-10-CM

## 2022-01-06 LAB — LIPID PANEL
Cholesterol: 189 mg/dL (ref 0–200)
HDL: 45.9 mg/dL (ref >39.00)
LDL Cholesterol: 122 mg/dL — ABNORMAL HIGH (ref 0–99)
NonHDL: 143.06
Total CHOL/HDL Ratio: 4
Triglycerides: 104 mg/dL (ref 0.0–149.0)
VLDL: 20.8 mg/dL (ref 0.0–40.0)

## 2022-01-06 LAB — COMPREHENSIVE METABOLIC PANEL
ALT: 20 U/L (ref 0–35)
AST: 25 U/L (ref 0–37)
Albumin: 4.1 g/dL (ref 3.5–5.2)
Alkaline Phosphatase: 56 U/L (ref 39–117)
BUN: 10 mg/dL (ref 6–23)
CO2: 27 mEq/L (ref 19–32)
Calcium: 9 mg/dL (ref 8.4–10.5)
Chloride: 104 mEq/L (ref 96–112)
Creatinine, Ser: 0.76 mg/dL (ref 0.40–1.20)
GFR: 105.25 mL/min (ref >60.00)
Glucose, Bld: 94 mg/dL (ref 70–99)
Potassium: 4 mEq/L (ref 3.5–5.1)
Sodium: 138 mEq/L (ref 135–145)
Total Bilirubin: 0.6 mg/dL (ref 0.2–1.2)
Total Protein: 6.7 g/dL (ref 6.0–8.3)

## 2022-01-06 LAB — CBC WITH DIFFERENTIAL/PLATELET
Basophils Absolute: 0 10*3/uL (ref 0.0–0.1)
Basophils Relative: 0.5 % (ref 0.0–3.0)
Eosinophils Absolute: 0 10*3/uL (ref 0.0–0.7)
Eosinophils Relative: 0.7 % (ref 0.0–5.0)
HCT: 37.2 % (ref 36.0–46.0)
Hemoglobin: 11.6 g/dL — ABNORMAL LOW (ref 12.0–15.0)
Lymphocytes Relative: 42.9 % (ref 12.0–46.0)
Lymphs Abs: 2.4 10*3/uL (ref 0.7–4.0)
MCHC: 31.3 g/dL (ref 30.0–36.0)
MCV: 77.8 fl — ABNORMAL LOW (ref 78.0–100.0)
Monocytes Absolute: 0.4 10*3/uL (ref 0.1–1.0)
Monocytes Relative: 7.7 % (ref 3.0–12.0)
Neutro Abs: 2.7 10*3/uL (ref 1.4–7.7)
Neutrophils Relative %: 48.2 % (ref 43.0–77.0)
Platelets: 285 10*3/uL (ref 150.0–400.0)
RBC: 4.77 Mil/uL (ref 3.87–5.11)
RDW: 14.9 % (ref 11.5–15.5)
WBC: 5.7 10*3/uL (ref 4.0–10.5)

## 2022-01-06 LAB — TSH: TSH: 1.32 u[IU]/mL (ref 0.35–5.50)

## 2022-01-06 NOTE — Assessment & Plan Note (Addendum)
chronic pt asking to increase dose, having more migraines lately advised to increase to 1.5 pills qhs = 75mg  no refill needed today f/u 12mos

## 2022-01-06 NOTE — Progress Notes (Signed)
Phone [phone]  Subjective:   Patient is a 31 y.o. female presenting for annual physical.    Chief Complaint  Patient presents with   Annual Exam    Fasting W/Labs    See problem oriented charting- ROS- full  review of systems was completed and negative  The following were reviewed and entered/updated in epic: Past Medical History:  Diagnosis Date   Abdominal pain, left lower quadrant 07/03/2012   Amniotic fluid leaking 07/25/2013   Anemia    Anxiety    Chlamydia    Family planning counseling 03/05/2012   GERD (gastroesophageal reflux disease)    Heart burn 02/27/2012   Migraine    Normal delivery 07/26/2013   Ovarian cyst    Supervision of normal first pregnancy in first trimester 12/25/2012   Trichomonal vaginitis 01/29/2013   Patient Active Problem List   Diagnosis Date Noted   Obesity (BMI 35.0-39.9 without comorbidity) 01/06/2022   Generalized anxiety disorder 12/15/2021   Family history of breast cancer in mother 12/15/2021   Migraine 03/05/2012   History reviewed. No pertinent surgical history.  Family History  Problem Relation Age of Onset   Diabetes Maternal Grandmother    Cancer Maternal Grandfather        lung   Depression Mother    Hypertension Mother    Anxiety disorder Mother    Bipolar disorder Mother    Cancer Mother    Bipolar disorder Father    Hypertension Maternal Aunt    Hypertension Maternal Uncle    Diabetes Maternal Uncle    Hypertension Paternal Aunt    Diabetes Paternal Aunt    Hypertension Paternal Uncle    Diabetes Paternal Uncle     Medications- reviewed and updated Current Outpatient Medications  Medication Sig Dispense Refill   albuterol (VENTOLIN HFA) 108 (90 Base) MCG/ACT inhaler Inhale 2 puffs into the lungs every 6 (six) hours as needed.     amitriptyline (ELAVIL) 50 MG tablet Take 75 mg by mouth at bedtime.     clobetasol cream (TEMOVATE) 0.05 % APPLY A THIN LAYER TO THE AFFECTED AREA(S) BY TOPICAL ROUTE 2 TIMES  PER DAY for up to 2 weeks     sertraline (ZOLOFT) 50 MG tablet Take 50 mg by mouth daily.     No current facility-administered medications for this visit.    Allergies-reviewed and updated No Known Allergies  Social History   Social History Narrative   Not on file    Objective:  BP 117/79 (BP Location: Left Arm, Patient Position: Sitting, Cuff Size: Large)   Pulse 100   Temp 97.8 F (36.6 C) (Temporal)   Ht 5' 6.5" (1.689 m)   Wt 245 lb 6 oz (111.3 kg)   LMP 12/18/2021 (Exact Date)   SpO2 99%   BMI 39.01 kg/m  Physical Exam Vitals and nursing note reviewed.  Constitutional:      Appearance: Normal appearance.  HENT:     Head: Normocephalic.     Right Ear: Tympanic membrane normal.     Left Ear: Tympanic membrane normal.     Nose: Nose normal.     Mouth/Throat:     Mouth: Mucous membranes are moist.  Eyes:     Pupils: Pupils are equal, round, and reactive to light.  Cardiovascular:     Rate and Rhythm: Normal rate and regular rhythm.  Pulmonary:     Effort: Pulmonary effort is normal.     Breath sounds: Normal breath sounds.  Musculoskeletal:  General: Normal range of motion.     Cervical back: Normal range of motion.  Lymphadenopathy:     Cervical: No cervical adenopathy.  Skin:    General: Skin is warm and dry.  Neurological:     Mental Status: She is alert.  Psychiatric:        Mood and Affect: Mood normal.        Behavior: Behavior normal.      Assessment and Plan   Health Maintenance counseling: 1. Anticipatory guidance: Patient counseled regarding regular dental exams q6 months, eye exams,  avoiding smoking and second hand smoke, limiting alcohol to 1 beverage per day, no illicit drugs.   2. Risk factor reduction:  Advised patient of need for regular exercise and diet rich with fruits and vegetables to reduce risk of heart attack and stroke. Wt Readings from Last 3 Encounters:  01/06/22 245 lb 6 oz (111.3 kg)  12/15/21 243 lb 9.6 oz (110.5  kg)  07/25/13 223 lb (101.2 kg)   3. Immunizations/screenings/ancillary studies Immunization History  Administered Date(s) Administered   DTaP 12/16/1991, 03/13/1992, 05/08/1992, 01/14/1993   HIB (PRP-T) 12/16/1991, 03/13/1992, 05/08/1992, 01/14/1993   HPV Quadrivalent 09/19/2005, 04/06/2006, 05/28/2007, 09/04/2012   Hep B, Unspecified 09/04/2009   Hepatitis A, Ped/Adol-2 Dose 09/19/2005, 05/28/2007   Hepatitis B, PED/ADOLESCENT May 11, 1991, 12/16/1991, 05/08/1992   IPV 12/16/1991, 03/13/1992, 05/08/1992, 07/10/2008   Influenza Inj Mdck Quad With Preservative 12/06/2018   Influenza,inj,Quad PF,6+ Mos 01/29/2013, 12/22/2017, 12/28/2018, 01/03/2020, 12/25/2020   Influenza-Unspecified 01/05/2013, 12/22/2017, 11/05/2021, 11/24/2021   Janssen (J&J) SARS-COV-2 Vaccination 09/02/2019   MMR 01/14/1993, 05/28/2007   Meningococcal Conjugate 05/28/2007   Moderna Sars-Covid-2 Vaccination 05/23/2020   Td (Adult),5 Lf Tetanus Toxid, Preservative Free 05/28/2007   Tdap 05/28/2007, 12/15/2021   Varicella 05/28/2007, 07/10/2008, 04/07/2012   Health Maintenance Due  Topic Date Due   Hepatitis C Screening  Never done    4. Cervical cancer screening: last week 5. Skin cancer screening- advised regular sunscreen use. Denies worrisome, changing, or new skin lesions.  6. Birth control/STD check: none 7. Smoking associated screening: non- smoker 8. Alcohol screening: none  Problem List Items Addressed This Visit       Cardiovascular and Mediastinum   Migraine    chronic pt asking to increase dose, having more migraines lately advised to increase to 1.5 pills qhs = 26m no refill needed today f/u 665mo       Other   Obesity (BMI 35.0-39.9 without comorbidity)    chronic wt. loss strategies reviewed including portion control, less carbs including sweets, eating most of calories earlier in day, drinking 64oz water qd, and establishing daily exercise routine.       Other Visit Diagnoses      Annual physical exam    -  Primary   Relevant Orders   Comprehensive metabolic panel (Completed)   TSH (Completed)   Lipid panel (Completed)   CBC with Differential/Platelet (Completed)       Recommended follow up: No follow-ups on file. No future appointments.  Lab/Order associations:fasting    HuJeanie SewerNP

## 2022-01-06 NOTE — Assessment & Plan Note (Signed)
chronic wt. loss strategies reviewed including portion control, less carbs including sweets, eating most of calories earlier in day, drinking 64oz water qd, and establishing daily exercise routine.

## 2022-01-09 NOTE — Progress Notes (Signed)
Your glucose, electrolytes, blood count (very mild anemia noted),  thyroid, liver & kidney function are all normal.  Your cholesterol numbers are all good, just your LDL (bad #) is high. Need to reduce any fried foods, nonnutritional snacks e.g. chips/cookies,pies, cakes and candies, fatty meat (red meat), high fat dairy foods:  including cheese, milk, ice cream.  Increase fruits/vegetables/fiber.   Continue or restart an exercise routine, shooting for 54min 5-7days per week.   Any questions, let me know!

## 2022-01-19 ENCOUNTER — Other Ambulatory Visit: Payer: Self-pay | Admitting: Obstetrics and Gynecology

## 2022-01-19 DIAGNOSIS — R928 Other abnormal and inconclusive findings on diagnostic imaging of breast: Secondary | ICD-10-CM

## 2022-02-01 ENCOUNTER — Encounter: Payer: Self-pay | Admitting: Family

## 2022-02-02 ENCOUNTER — Other Ambulatory Visit: Payer: Self-pay

## 2022-02-02 ENCOUNTER — Other Ambulatory Visit (HOSPITAL_COMMUNITY): Payer: Self-pay

## 2022-02-02 DIAGNOSIS — F411 Generalized anxiety disorder: Secondary | ICD-10-CM

## 2022-02-02 MED ORDER — AMITRIPTYLINE HCL 50 MG PO TABS
75.0000 mg | ORAL_TABLET | Freq: Every day | ORAL | 1 refills | Status: DC
  Filled 2022-02-02 – 2022-03-10 (×2): qty 90, 60d supply, fill #0
  Filled 2022-07-21: qty 90, 60d supply, fill #1

## 2022-02-02 MED ORDER — SERTRALINE HCL 50 MG PO TABS
50.0000 mg | ORAL_TABLET | Freq: Every day | ORAL | 1 refills | Status: DC
  Filled 2022-02-02 – 2022-03-10 (×2): qty 90, 90d supply, fill #0
  Filled 2022-07-21: qty 90, 90d supply, fill #1

## 2022-02-02 NOTE — Telephone Encounter (Signed)
ok to send 90 pills with 1 refill for both, thx

## 2022-02-03 ENCOUNTER — Other Ambulatory Visit (HOSPITAL_COMMUNITY): Payer: Self-pay

## 2022-02-04 ENCOUNTER — Ambulatory Visit
Admission: RE | Admit: 2022-02-04 | Discharge: 2022-02-04 | Disposition: A | Discharge status: Home / Self Care | Payer: No Typology Code available for payment source | Source: Ambulatory Visit | Attending: Obstetrics and Gynecology | Admitting: Obstetrics and Gynecology

## 2022-02-04 DIAGNOSIS — R928 Other abnormal and inconclusive findings on diagnostic imaging of breast: Secondary | ICD-10-CM

## 2022-02-07 ENCOUNTER — Other Ambulatory Visit: Payer: Self-pay | Admitting: Obstetrics and Gynecology

## 2022-02-07 DIAGNOSIS — N6489 Other specified disorders of breast: Secondary | ICD-10-CM

## 2022-02-11 ENCOUNTER — Other Ambulatory Visit (HOSPITAL_COMMUNITY): Payer: Self-pay

## 2022-02-21 ENCOUNTER — Ambulatory Visit: Payer: No Typology Code available for payment source

## 2022-03-08 ENCOUNTER — Other Ambulatory Visit (HOSPITAL_COMMUNITY): Payer: Self-pay

## 2022-03-08 ENCOUNTER — Telehealth: Payer: 59 | Admitting: Physician Assistant

## 2022-03-08 DIAGNOSIS — J019 Acute sinusitis, unspecified: Secondary | ICD-10-CM | POA: Diagnosis not present

## 2022-03-08 DIAGNOSIS — B9689 Other specified bacterial agents as the cause of diseases classified elsewhere: Secondary | ICD-10-CM

## 2022-03-08 MED ORDER — FLUTICASONE PROPIONATE 50 MCG/ACT NA SUSP
2.0000 | Freq: Every day | NASAL | 0 refills | Status: DC
  Filled 2022-03-08: qty 16, 30d supply, fill #0

## 2022-03-08 MED ORDER — AMOXICILLIN-POT CLAVULANATE 875-125 MG PO TABS
1.0000 | ORAL_TABLET | Freq: Two times a day (BID) | ORAL | 0 refills | Status: DC
  Filled 2022-03-08: qty 14, 7d supply, fill #0

## 2022-03-08 MED ORDER — BENZONATATE 100 MG PO CAPS
100.0000 mg | ORAL_CAPSULE | Freq: Three times a day (TID) | ORAL | 0 refills | Status: DC | PRN
  Filled 2022-03-08: qty 30, 10d supply, fill #0

## 2022-03-08 NOTE — Patient Instructions (Signed)
Veronica Hines, thank you for joining Leeanne Rio, PA-C for today's virtual visit.  While this provider is not your primary care provider (PCP), if your PCP is located in our provider database this encounter information will be shared with them immediately following your visit.   [facility] account gives you access to today's visit and all your visits, tests, and labs performed at [facility] " click here if you don't have a [facility] account or go to [email or web address]  Consent: (Patient) Veronica Hines provided verbal consent for this virtual visit at the beginning of the encounter.  Current Medications:  Current Outpatient Medications:    albuterol (VENTOLIN HFA) 108 (90 Base) MCG/ACT inhaler, Inhale 2 puffs into the lungs every 6 (six) hours as needed., Disp: , Rfl:    amitriptyline (ELAVIL) 50 MG tablet, Take 1.5 tablets (75 mg total) by mouth at bedtime., Disp: 90 tablet, Rfl: 1   clobetasol cream (TEMOVATE) 0.05 %, APPLY A THIN LAYER TO THE AFFECTED AREA(S) BY TOPICAL ROUTE 2 TIMES PER DAY for up to 2 weeks, Disp: , Rfl:    sertraline (ZOLOFT) 50 MG tablet, Take 1 tablet (50 mg total) by mouth daily., Disp: 90 tablet, Rfl: 1   Medications ordered in this encounter:  No orders of the defined types were placed in this encounter.    *If you need refills on other medications prior to your next appointment, please contact your pharmacy*  Follow-Up: Call back or seek an in-person evaluation if the symptoms worsen or if the condition fails to improve as anticipated.  [facility] [phone]  Other Instructions Please take antibiotic as directed.  Increase fluid intake.  Use Saline nasal spray.  Take a daily multivitamin.  Use the Tessalon and Flonase as directed.  Okay to continue your OTC medications.  Place a humidifier in the bedroom.  Please call or return clinic if symptoms are not improving.  Sinusitis Sinusitis  is redness, soreness, and swelling (inflammation) of the paranasal sinuses. Paranasal sinuses are air pockets within the bones of your face (beneath the eyes, the middle of the forehead, or above the eyes). In healthy paranasal sinuses, mucus is able to drain out, and air is able to circulate through them by way of your nose. However, when your paranasal sinuses are inflamed, mucus and air can become trapped. This can allow bacteria and other germs to grow and cause infection. Sinusitis can develop quickly and last only a short time (acute) or continue over a long period (chronic). Sinusitis that lasts for more than 12 weeks is considered chronic.  CAUSES  Causes of sinusitis include: Allergies. Structural abnormalities, such as displacement of the cartilage that separates your nostrils (deviated septum), which can decrease the air flow through your nose and sinuses and affect sinus drainage. Functional abnormalities, such as when the small hairs (cilia) that line your sinuses and help remove mucus do not work properly or are not present. SYMPTOMS  Symptoms of acute and chronic sinusitis are the same. The primary symptoms are pain and pressure around the affected sinuses. Other symptoms include: Upper toothache. Earache. Headache. Bad breath. Decreased sense of smell and taste. A cough, which worsens when you are lying flat. Fatigue. Fever. Thick drainage from your nose, which often is green and may contain pus (purulent). Swelling and warmth over the affected sinuses. DIAGNOSIS  Your caregiver will perform a physical exam. During the exam, your caregiver may: Look in your nose  for signs of abnormal growths in your nostrils (nasal polyps). Tap over the affected sinus to check for signs of infection. View the inside of your sinuses (endoscopy) with a special imaging device with a light attached (endoscope), which is inserted into your sinuses. If your caregiver suspects that you have chronic  sinusitis, one or more of the following tests may be recommended: Allergy tests. Nasal culture A sample of mucus is taken from your nose and sent to a lab and screened for bacteria. Nasal cytology A sample of mucus is taken from your nose and examined by your caregiver to determine if your sinusitis is related to an allergy. TREATMENT  Most cases of acute sinusitis are related to a viral infection and will resolve on their own within 10 days. Sometimes medicines are prescribed to help relieve symptoms (pain medicine, decongestants, nasal steroid sprays, or saline sprays).  However, for sinusitis related to a bacterial infection, your caregiver will prescribe antibiotic medicines. These are medicines that will help kill the bacteria causing the infection.  Rarely, sinusitis is caused by a fungal infection. In theses cases, your caregiver will prescribe antifungal medicine. For some cases of chronic sinusitis, surgery is needed. Generally, these are cases in which sinusitis recurs more than 3 times per year, despite other treatments. HOME CARE INSTRUCTIONS  Drink plenty of water. Water helps thin the mucus so your sinuses can drain more easily. Use a humidifier. Inhale steam 3 to 4 times a day (for example, sit in the bathroom with the shower running). Apply a warm, moist washcloth to your face 3 to 4 times a day, or as directed by your caregiver. Use saline nasal sprays to help moisten and clean your sinuses. Take over-the-counter or prescription medicines for pain, discomfort, or fever only as directed by your caregiver. SEEK IMMEDIATE MEDICAL CARE IF: You have increasing pain or severe headaches. You have nausea, vomiting, or drowsiness. You have swelling around your face. You have vision problems. You have a stiff neck. You have difficulty breathing. MAKE SURE YOU:  Understand these instructions. Will watch your condition. Will get help right away if you are not doing well or get  worse. Document Released: 02/21/2005 Document Revised: 05/16/2011 Document Reviewed: 03/08/2011 [facility] Patient Information 2014 [place], [place].    If you have been instructed to have an in-person evaluation today at a local Urgent Care facility, please use the link below. It will take you to a list of all of our available [facility], including address, phone number and hours of operation. Please do not delay care.  [facility]  If you or a family member do not have a primary care provider, use the link below to schedule a visit and establish care. When you choose a [facility] primary care physician or advanced practice provider, you gain a long-term partner in health. Find a Primary Care Provider  Learn more about 's in-office and virtual care options: [facility]

## 2022-03-08 NOTE — Progress Notes (Signed)
Virtual Visit Consent   Veronica HOLQUIN, you are scheduled for a virtual visit with a [facility] provider today. Just as with appointments in the office, your consent must be obtained to participate. Your consent will be active for this visit and any virtual visit you may have with one of our providers in the next 365 days. If you have a MyChart account, a copy of this consent can be sent to you electronically.  As this is a virtual visit, video technology does not allow for your provider to perform a traditional examination. This may limit your provider's ability to fully assess your condition. If your provider identifies any concerns that need to be evaluated in person or the need to arrange testing (such as labs, EKG, etc.), we will make arrangements to do so. Although advances in technology are sophisticated, we cannot ensure that it will always work on either your end or our end. If the connection with a video visit is poor, the visit may have to be switched to a telephone visit. With either a video or telephone visit, we are not always able to ensure that we have a secure connection.  By engaging in this virtual visit, you consent to the provision of healthcare and authorize for your insurance to be billed (if applicable) for the services provided during this visit. Depending on your insurance coverage, you may receive a charge related to this service.  I need to obtain your verbal consent now. Are you willing to proceed with your visit today? Veronica Hines has provided verbal consent on 03/08/2022 for a virtual visit (video or telephone). Veronica Hines, [place]  Date: 03/08/2022 6:04 PM  Virtual Visit via Video Note   I, Veronica Hines, connected with  Veronica Hines  ([id number], August 06, 1990) on 03/08/22 at  6:00 PM EST by a video-enabled telemedicine application and verified that I am speaking with the correct person using two identifiers.  Location: Patient: Virtual Visit Location  Patient: Home Provider: Virtual Visit Location Provider: Home Office   I discussed the limitations of evaluation and management by telemedicine and the availability of in person appointments. The patient expressed understanding and agreed to proceed.    History of Present Illness: [name] is a 32 y.o. who identifies as a female who was assigned female at birth, and is being seen today for ongoing upper respiratory symptoms after recent COVID infection.  Notes her initial symptoms improved but she has had ongoing nasal and sinus congestion, now with sinus pressure and sinus pain with increased frequency of sinus headache.  Denies fever, chills or aches.  Some cough secondary to drainage.  Notes the PND is causing sore throat.  He is taking OTC DayQuil, TheraFlu and Mucinex.  Denies concern for pregnancy.  Is not breast-feeding.Veronica Hines   HPI: HPI  Problems:  Patient Active Problem List   Diagnosis Date Noted   Obesity (BMI 35.0-39.9 without comorbidity) 01/06/2022   Generalized anxiety disorder 12/15/2021   Family history of breast cancer in mother 12/15/2021   Migraine 03/05/2012    Allergies: No Known Allergies Medications:  Current Outpatient Medications:    amoxicillin-clavulanate (AUGMENTIN) 875-125 MG tablet, Take 1 tablet by mouth 2 (two) times daily., Disp: 14 tablet, Rfl: 0   benzonatate (TESSALON) 100 MG capsule, Take 1 capsule (100 mg total) by mouth 3 (three) times daily as needed for cough., Disp: 30 capsule, Rfl: 0   fluticasone (FLONASE) 50 MCG/ACT nasal spray, Place 2 sprays into  both nostrils daily., Disp: 16 g, Rfl: 0   albuterol (VENTOLIN HFA) 108 (90 Base) MCG/ACT inhaler, Inhale 2 puffs into the lungs every 6 (six) hours as needed., Disp: , Rfl:    amitriptyline (ELAVIL) 50 MG tablet, Take 1.5 tablets (75 mg total) by mouth at bedtime., Disp: 90 tablet, Rfl: 1   clobetasol cream (TEMOVATE) 0.05 %, APPLY A THIN LAYER TO THE AFFECTED AREA(S) BY TOPICAL ROUTE 2 TIMES PER  DAY for up to 2 weeks, Disp: , Rfl:    sertraline (ZOLOFT) 50 MG tablet, Take 1 tablet (50 mg total) by mouth daily., Disp: 90 tablet, Rfl: 1  Observations/Objective: Patient is well-developed, well-nourished in no acute distress.  Resting comfortably at home.  Head is normocephalic, atraumatic.  No labored breathing. Speech is clear and coherent with logical content.  Patient is alert and oriented at baseline.   Assessment and Plan: 1. Acute bacterial sinusitis - fluticasone (FLONASE) 50 MCG/ACT nasal spray; Place 2 sprays into both nostrils daily.  Dispense: 16 g; Refill: 0 - benzonatate (TESSALON) 100 MG capsule; Take 1 capsule (100 mg total) by mouth 3 (three) times daily as needed for cough.  Dispense: 30 capsule; Refill: 0 - amoxicillin-clavulanate (AUGMENTIN) 875-125 MG tablet; Take 1 tablet by mouth 2 (two) times daily.  Dispense: 14 tablet; Refill: 0  Post-COVID. Rx Augmentin.  Increase fluids.  Rest.  Saline nasal spray.  Probiotic.  Mucinex as directed.  Humidifier in bedroom.  Tessalon and Flonase per orders.  Call or return to clinic if symptoms are not improving.   Follow Up Instructions: I discussed the assessment and treatment plan with the patient. The patient was provided an opportunity to ask questions and all were answered. The patient agreed with the plan and demonstrated an understanding of the instructions.  A copy of instructions were sent to the patient via MyChart unless otherwise noted below.   The patient was advised to call back or seek an in-person evaluation if the symptoms worsen or if the condition fails to improve as anticipated.  Time:  I spent 10 minutes with the patient via telehealth technology discussing the above problems/concerns.    Veronica Rio, PA-C

## 2022-03-09 ENCOUNTER — Other Ambulatory Visit (HOSPITAL_COMMUNITY): Payer: Self-pay

## 2022-03-10 ENCOUNTER — Other Ambulatory Visit (HOSPITAL_COMMUNITY): Payer: Self-pay

## 2022-04-07 ENCOUNTER — Ambulatory Visit (INDEPENDENT_AMBULATORY_CARE_PROVIDER_SITE_OTHER): Payer: 59 | Admitting: Family

## 2022-04-07 ENCOUNTER — Encounter: Payer: Self-pay | Admitting: Family

## 2022-04-07 VITALS — BP 146/92 | HR 103 | Temp 97.8°F | Ht 66.5 in | Wt 241.6 lb

## 2022-04-07 DIAGNOSIS — R635 Abnormal weight gain: Secondary | ICD-10-CM

## 2022-04-07 DIAGNOSIS — E669 Obesity, unspecified: Secondary | ICD-10-CM | POA: Diagnosis not present

## 2022-04-07 DIAGNOSIS — G43909 Migraine, unspecified, not intractable, without status migrainosus: Secondary | ICD-10-CM | POA: Diagnosis not present

## 2022-04-07 NOTE — Progress Notes (Signed)
Patient ID: Veronica Hines, female    DOB: 26-Jun-1990, 32 y.o.   MRN: [id number]  Chief Complaint  Patient presents with   Weight Gain    Pt would like to discuss weight gain since being on amitriptyline. Pt has been on the medication for almost a year.     HPI: Weight gain:  reports working out daily, walking more, using stairs, increased water, not drinking as much caffeine or sugary drinks.  Reports using Saxenda in past which worked and she kept the weight off after stopping for over a year, also reports taking Wegovy, but caused bad SE.  Pt is asking if Phentermine is an option.  Migraines:  has been on propranolol and Topamax prior to getting on Elavil; last visit we increased Elavil dose to 75mg  qd which has helped & she has had no migraines, but she is concerned about weight gain she has had over the last year and if r/t the Elavil.    Assessment & Plan:   Problem List Items Addressed This Visit       Cardiovascular and Mediastinum   Migraine - Primary    chronic taking 75mg  Elavil qhs, doing well at this dose, no migraines reported asking if med causing wt gain - advised she has been able to lose wt while taking, very doubtful, discussed benefit of med outweighing any SE as she has failed other meds if this dose stops being effective, can look at [facility] meds - Ajovy, Aimovig f/u prn        Other   Obesity (BMI 35.0-39.9 without comorbidity)    chronic hx of Saxenda, tolerated, successful, kept wt off for over a year when stopped; SE with Wegovy advised to check w/ins to see if/what is covered - Zepbound other option continue to advise on wt loss strategies f/u prn      Abnormal weight gain    chronic hx of Saxenda, tolerated, successful, kept wt off for over a year when stopped; SE with Wegovy advised to check w/ins to see if/what is covered - Zepbound other option offered referral to [facility], pt wants to wait for now continue to advise on wt loss  strategies f/u prn       Subjective:    Outpatient Medications Prior to Visit  Medication Sig Dispense Refill   albuterol (VENTOLIN HFA) 108 (90 Base) MCG/ACT inhaler Inhale 2 puffs into the lungs every 6 (six) hours as needed.     amitriptyline (ELAVIL) 50 MG tablet Take 1.5 tablets (75 mg total) by mouth at bedtime. 90 tablet 1   clobetasol cream (TEMOVATE) 0.05 % APPLY A THIN LAYER TO THE AFFECTED AREA(S) BY TOPICAL ROUTE 2 TIMES PER DAY for up to 2 weeks     sertraline (ZOLOFT) 50 MG tablet Take 1 tablet (50 mg total) by mouth daily. 90 tablet 1   amoxicillin-clavulanate (AUGMENTIN) 875-125 MG tablet Take 1 tablet by mouth 2 (two) times daily. 14 tablet 0   benzonatate (TESSALON) 100 MG capsule Take 1 capsule (100 mg total) by mouth 3 (three) times daily as needed for cough. 30 capsule 0   fluticasone (FLONASE) 50 MCG/ACT nasal spray Place 2 sprays into both nostrils daily. 16 g 0   No facility-administered medications prior to visit.   Past Medical History:  Diagnosis Date   Abdominal pain, left lower quadrant 07/03/2012   Amniotic fluid leaking 07/25/2013   Anemia    Anxiety    Chlamydia    Family planning  counseling 03/05/2012   GERD (gastroesophageal reflux disease)    Heart burn 02/27/2012   Migraine    Normal delivery 07/26/2013   Ovarian cyst    Supervision of normal first pregnancy in first trimester 12/25/2012   Trichomonal vaginitis 01/29/2013   No past surgical history on file. No Known Allergies    Objective:    Physical Exam Vitals and nursing note reviewed.  Constitutional:      Appearance: Normal appearance. She is obese.  Cardiovascular:     Rate and Rhythm: Normal rate and regular rhythm.  Pulmonary:     Effort: Pulmonary effort is normal.     Breath sounds: Normal breath sounds.  Musculoskeletal:        General: Normal range of motion.  Skin:    General: Skin is warm and dry.  Neurological:     Mental Status: She is alert.  Psychiatric:         Mood and Affect: Mood normal.        Behavior: Behavior normal.    BP (!) 146/92 (BP Location: Left Arm, Patient Position: Sitting, Cuff Size: Large)   Pulse (!) 103   Temp 97.8 F (36.6 C) (Temporal)   Ht 5' 6.5" (1.689 m)   Wt 241 lb 9.6 oz (109.6 kg)   LMP 03/19/2022 (Exact Date)   SpO2 100%   BMI 38.41 kg/m  Wt Readings from Last 3 Encounters:  04/07/22 241 lb 9.6 oz (109.6 kg)  01/06/22 245 lb 6 oz (111.3 kg)  12/15/21 243 lb 9.6 oz (110.5 kg)       Jeanie Sewer, NP

## 2022-04-07 NOTE — Assessment & Plan Note (Signed)
chronic hx of Saxenda, tolerated, successful, kept wt off for over a year when stopped; SE with Wegovy advised to check w/ins to see if/what is covered - Zepbound other option continue to advise on wt loss strategies f/u prn

## 2022-04-07 NOTE — Assessment & Plan Note (Addendum)
chronic hx of Saxenda, tolerated, successful, kept wt off for over a year when stopped; SE with Wegovy advised to check w/ins to see if/what is covered - Zepbound other option offered referral to [facility], pt wants to wait for now continue to advise on wt loss strategies f/u prn

## 2022-04-07 NOTE — Assessment & Plan Note (Signed)
chronic taking 75mg  Elavil qhs, doing well at this dose, no migraines reported asking if med causing wt gain - advised she has been able to lose wt while taking, very doubtful, discussed benefit of med outweighing any SE as she has failed other meds if this dose stops being effective, can look at [facility] meds - Ajovy, Aimovig f/u prn

## 2022-04-14 ENCOUNTER — Ambulatory Visit: Payer: 59 | Admitting: Family

## 2022-05-22 ENCOUNTER — Ambulatory Visit: Payer: Self-pay

## 2022-06-27 ENCOUNTER — Telehealth: Payer: Self-pay | Admitting: Family

## 2022-06-27 NOTE — Telephone Encounter (Signed)
Pt states she needs a referral to an eye dr. She tore her retina, I think, and she needs an emergency referral.e advise.

## 2022-07-22 ENCOUNTER — Other Ambulatory Visit (HOSPITAL_COMMUNITY): Payer: Self-pay

## 2022-08-09 ENCOUNTER — Other Ambulatory Visit: Payer: Self-pay | Admitting: Obstetrics and Gynecology

## 2022-08-09 ENCOUNTER — Ambulatory Visit
Admission: RE | Admit: 2022-08-09 | Discharge: 2022-08-09 | Disposition: A | Discharge status: Home / Self Care | Payer: 59 | Source: Ambulatory Visit | Attending: Obstetrics and Gynecology | Admitting: Obstetrics and Gynecology

## 2022-08-09 DIAGNOSIS — N6489 Other specified disorders of breast: Secondary | ICD-10-CM

## 2022-10-10 ENCOUNTER — Telehealth: Payer: 59 | Admitting: Physician Assistant

## 2022-10-10 ENCOUNTER — Other Ambulatory Visit (HOSPITAL_COMMUNITY): Payer: Self-pay

## 2022-10-10 DIAGNOSIS — J019 Acute sinusitis, unspecified: Secondary | ICD-10-CM | POA: Diagnosis not present

## 2022-10-10 DIAGNOSIS — B9689 Other specified bacterial agents as the cause of diseases classified elsewhere: Secondary | ICD-10-CM | POA: Diagnosis not present

## 2022-10-10 MED ORDER — AMOXICILLIN-POT CLAVULANATE 875-125 MG PO TABS
1.0000 | ORAL_TABLET | Freq: Two times a day (BID) | ORAL | 0 refills | Status: DC
  Filled 2022-10-10: qty 14, 7d supply, fill #0

## 2022-10-10 NOTE — Progress Notes (Signed)

## 2022-10-11 ENCOUNTER — Other Ambulatory Visit (HOSPITAL_COMMUNITY): Payer: Self-pay

## 2022-10-19 ENCOUNTER — Other Ambulatory Visit (HOSPITAL_COMMUNITY): Payer: Self-pay

## 2022-11-04 ENCOUNTER — Encounter: Payer: 59 | Admitting: Family

## 2022-11-18 ENCOUNTER — Encounter: Payer: 59 | Admitting: Family

## 2022-11-24 NOTE — Patient Instructions (Addendum)
It was very nice to see you today!   I will review your lab results via MyChart next week. I have sent in Metoprolol to start at bedtime to help prevent your migraines. I also have sent Emgality for prevention, but this will require a prior authorization first. Try the Qulipta samples for one time use at the very start of headache to stop migraine.  Have a great weekend!       PLEASE NOTE:  If you had any lab tests please let us know if you have not heard back within a few days. You may see your results on MyChart before we have a chance to review them but we will give you a call once they are reviewed by [place]. If we ordered any referrals today, please let us know if you have not heard from their office within the next week.

## 2022-11-24 NOTE — Progress Notes (Signed)
Phone [phone]  Subjective:   Patient is a 32 y.o. female presenting for annual physical.    Chief Complaint  Patient presents with   Annual Exam    Non fasting w/ labs    HPI: Hyperlipidemia: Patient is currently maintained on the following medication for hyperlipidemia: none, pt working on lifestyle modifications. Patient reports good compliance with low fat/low cholesterol diet.  Last lipid panel as follows: Lab Results  Component Value Date   CHOL 189 01/06/2022   HDL 45.90 01/06/2022   LDLCALC 122 (H) 01/06/2022   TRIG 104.0 01/06/2022   CHOLHDL 4 01/06/2022   Migraines:  has been on propranolol and Topamax prior to getting on Elavil; last visit we increased Elavil dose to 75mg  qd which has helped & she has had no migraines, but she is concerned about weight gain she has had over the last year and if r/t the Elavil.  Anxiety:  pt reports her anxiety is bad when she gets overwhelmed. 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 She has been on & off Zoloft which works well for her when taking.   See problem oriented charting- ROS- full  review of systems was completed and negative except for what is noted above in HPI.  The following were reviewed and entered/updated in epic: Past Medical History:  Diagnosis Date   Abdominal pain, left lower quadrant 07/03/2012   Amniotic fluid leaking 07/25/2013   Anemia    Anxiety    Chlamydia    Family planning counseling 03/05/2012   GERD (gastroesophageal reflux disease)    Heart burn 02/27/2012   Migraine    Normal delivery 07/26/2013   Ovarian cyst    Supervision of normal first pregnancy in first trimester 12/25/2012   Trichomonal vaginitis 01/29/2013   Patient Active Problem List   Diagnosis Date Noted   High serum low-density lipoprotein (LDL) 11/25/2022   Abnormal weight gain 04/07/2022   Obesity (BMI 35.0-39.9 without comorbidity) 01/06/2022   Generalized anxiety disorder  12/15/2021   Family history of breast cancer in mother 12/15/2021   Migraine 03/05/2012   No past surgical history on file.  Family History  Problem Relation Age of Onset   Breast cancer Mother    Depression Mother    Hypertension Mother    Anxiety disorder Mother    Bipolar disorder Mother    Cancer Mother    Bipolar disorder Father    Hypertension Maternal Aunt    Hypertension Maternal Uncle    Diabetes Maternal Uncle    Hypertension Paternal Aunt    Diabetes Paternal Aunt    Hypertension Paternal Uncle    Diabetes Paternal Uncle    Diabetes Maternal Grandmother    Cancer Maternal Grandfather        lung    Medications- reviewed and updated Current Outpatient Medications  Medication Sig Dispense Refill   albuterol (VENTOLIN HFA) 108 (90 Base) MCG/ACT inhaler Inhale 2 puffs into the lungs every 6 (six) hours as needed.     clobetasol cream (TEMOVATE) 0.05 % APPLY A THIN LAYER TO THE AFFECTED AREA(S) BY TOPICAL ROUTE 2 TIMES PER DAY for up to 2 weeks     Galcanezumab-gnlm (EMGALITY) 120 MG/ML SOSY Inject 120 mg into the skin every 30 (thirty) days. 1 mL 5   metoprolol succinate (TOPROL-XL) 25 MG 24 hr tablet Take 1 tablet (25 mg total) by mouth at bedtime. START with 1/2 pill for 1 week, if  tolerated, increase to 1 full pill. 30 tablet 2   sertraline (ZOLOFT) 50 MG tablet Take 1 tablet (50 mg total) by mouth daily. (Patient not taking: Reported on 11/25/2022) 90 tablet 1   No current facility-administered medications for this visit.    Allergies-reviewed and updated No Known Allergies  Social History   Social History Narrative   Not on file    Objective:  BP 126/80 (BP Location: Right Arm, Patient Position: Sitting, Cuff Size: Large)   Pulse 87   Temp 98 F (36.7 C) (Temporal)   Ht 5' 6.5" (1.689 m)   Wt 241 lb 12.8 oz (109.7 kg)   LMP 11/05/2022 (Exact Date)   SpO2 100%   BMI 38.44 kg/m  Physical Exam Vitals and nursing note reviewed.  Constitutional:       Appearance: Normal appearance. She is obese.  HENT:     Head: Normocephalic.     Right Ear: Tympanic membrane normal.     Left Ear: Tympanic membrane normal.     Nose: Nose normal.     Mouth/Throat:     Mouth: Mucous membranes are moist.  Eyes:     Pupils: Pupils are equal, round, and reactive to light.  Cardiovascular:     Rate and Rhythm: Normal rate and regular rhythm.  Pulmonary:     Effort: Pulmonary effort is normal.     Breath sounds: Normal breath sounds.  Musculoskeletal:        General: Normal range of motion.     Cervical back: Normal range of motion.  Lymphadenopathy:     Cervical: No cervical adenopathy.  Skin:    General: Skin is warm and dry.  Neurological:     Mental Status: She is alert.  Psychiatric:        Mood and Affect: Mood normal.        Behavior: Behavior normal.      Assessment and Plan   Health Maintenance counseling: 1. Anticipatory guidance: Patient counseled regarding regular dental exams q6 months, eye exams,  avoiding smoking and second hand smoke, limiting alcohol to 1 beverage per day, no illicit drugs.   2. Risk factor reduction:  Advised patient of need for regular exercise and diet rich with fruits and vegetables to reduce risk of heart attack and stroke. Wt Readings from Last 3 Encounters:  11/25/22 241 lb 12.8 oz (109.7 kg)  04/07/22 241 lb 9.6 oz (109.6 kg)  01/06/22 245 lb 6 oz (111.3 kg)   3. Immunizations/screenings/ancillary studies Immunization History  Administered Date(s) Administered   DTaP 12/16/1991, 03/13/1992, 05/08/1992, 01/14/1993   HIB (PRP-T) 12/16/1991, 03/13/1992, 05/08/1992, 01/14/1993   HPV Quadrivalent 09/19/2005, 04/06/2006, 05/28/2007, 09/04/2012   Hep B, Unspecified 09/04/2009   Hepatitis A, Ped/Adol-2 Dose 09/19/2005, 05/28/2007   Hepatitis B, PED/ADOLESCENT 04/19/1991, 12/16/1991, 05/08/1992   IPV 12/16/1991, 03/13/1992, 05/08/1992, 07/10/2008   Influenza Inj Mdck Quad With Preservative 12/06/2018    Influenza, Seasonal, Injecte, Preservative Fre 11/25/2022   Influenza,inj,Quad PF,6+ Mos 01/29/2013, 12/22/2017, 12/28/2018, 01/03/2020, 12/25/2020   Influenza-Unspecified 01/05/2013, 12/22/2017, 11/05/2021, 11/24/2021   Janssen (J&J) SARS-COV-2 Vaccination 09/02/2019   MMR 01/14/1993, 05/28/2007   Meningococcal Conjugate 05/28/2007   Moderna Sars-Covid-2 Vaccination 05/23/2020   Td (Adult),5 Lf Tetanus Toxid, Preservative Free 05/28/2007   Tdap 05/28/2007, 12/15/2021   Varicella 05/28/2007, 07/10/2008, 04/07/2012   Health Maintenance Due  Topic Date Due   Hepatitis C Screening  Never done    4. Cervical cancer screening: done 2023 5. Skin cancer screening- advised regular sunscreen  use. Denies worrisome, changing, or new skin lesions.  6. Birth control/STD check: none 7. Smoking associated screening: non- smoker 8. Alcohol screening: none  Problem List Items Addressed This Visit     Migraine    chronic taking 75mg  Elavil at bedtime, causing too much drowsiness still in am reports having a HA almost every day now that sometimes turns into a migraine taking at least 800mg  Ibuprofen 2-3x/day provided Qulipta 60mg  samples x 2 today to try sending Metoprolol 25mg  at bedtime for prevention and sending Emgality 120mg  qmonth, needs PA f/u 2-66mos      Relevant Medications   metoprolol succinate (TOPROL-XL) 25 MG 24 hr tablet   Galcanezumab-gnlm (EMGALITY) 120 MG/ML SOSY   Generalized anxiety disorder    chronic - pt reports sx starting when her mother dx w/breast cancer, now taking care of her daily  had been stable on Zoloft 50mg  every day, but then stopped taking, states she is feeling overwhelmed again, will restart, has plenty of pills  no refill needed today f/u 6 mos or prn       High serum low-density lipoprotein (LDL)    chronic last LDL 122 01/2022 no meds, pt working on lifestyle modifications checking lipids today continue to educate on low saturated fat diet,  exercise, weight loss f/u 1 yr      Relevant Orders   Lipid panel   Other Visit Diagnoses     Annual physical exam    -  Primary   Relevant Orders   Comp Met (CMET)   CBC with Differential/Platelet   Need for hepatitis C screening test       Relevant Orders   Hepatitis C Antibody   Immunization due       Relevant Orders   Flu vaccine trivalent PF, 6mos and older(Flulaval,Afluria,Fluarix,Fluzone) (Completed)      Recommended follow up: Return in about 2 months (around 01/25/2023) for migraines, med refills, can be virtual. No future appointments.   Lab/Order associations:  fasting    Dulce Sellar, NP

## 2022-11-25 ENCOUNTER — Other Ambulatory Visit (HOSPITAL_COMMUNITY): Payer: Self-pay

## 2022-11-25 ENCOUNTER — Encounter: Payer: Self-pay | Admitting: Family

## 2022-11-25 ENCOUNTER — Ambulatory Visit (INDEPENDENT_AMBULATORY_CARE_PROVIDER_SITE_OTHER): Payer: 59 | Admitting: Family

## 2022-11-25 VITALS — BP 126/80 | HR 87 | Temp 98.0°F | Ht 66.5 in | Wt 241.8 lb

## 2022-11-25 DIAGNOSIS — Z0000 Encounter for general adult medical examination without abnormal findings: Secondary | ICD-10-CM | POA: Diagnosis not present

## 2022-11-25 DIAGNOSIS — R7989 Other specified abnormal findings of blood chemistry: Secondary | ICD-10-CM | POA: Diagnosis not present

## 2022-11-25 DIAGNOSIS — F411 Generalized anxiety disorder: Secondary | ICD-10-CM

## 2022-11-25 DIAGNOSIS — G43711 Chronic migraine without aura, intractable, with status migrainosus: Secondary | ICD-10-CM

## 2022-11-25 DIAGNOSIS — Z23 Encounter for immunization: Secondary | ICD-10-CM | POA: Diagnosis not present

## 2022-11-25 DIAGNOSIS — Z1159 Encounter for screening for other viral diseases: Secondary | ICD-10-CM

## 2022-11-25 MED ORDER — METOPROLOL SUCCINATE ER 25 MG PO TB24
25.0000 mg | ORAL_TABLET | Freq: Every day | ORAL | 2 refills | Status: DC
Start: 2022-11-25 — End: 2023-10-18
  Filled 2022-11-25: qty 30, 30d supply, fill #0
  Filled 2023-01-25: qty 30, 30d supply, fill #1

## 2022-11-25 MED ORDER — EMGALITY 120 MG/ML ~~LOC~~ SOSY
120.0000 mg | PREFILLED_SYRINGE | SUBCUTANEOUS | 5 refills | Status: DC
Start: 2022-11-25 — End: 2023-01-03
  Filled 2022-11-25: qty 1, 30d supply, fill #0

## 2022-11-25 NOTE — Assessment & Plan Note (Addendum)
chronic taking 75mg  Elavil at bedtime, causing too much drowsiness still in am reports having a HA almost every day now that sometimes turns into a migraine taking at least 800mg  Ibuprofen 2-3x/day provided Qulipta 60mg  samples x 2 today to try sending Metoprolol 25mg  at bedtime for prevention and sending Emgality 120mg  qmonth, needs PA f/u 2-27mos

## 2022-11-25 NOTE — Assessment & Plan Note (Signed)
chronic - pt reports sx starting when her mother dx w/breast cancer, now taking care of her daily  had been stable on Zoloft 50mg  every day, but then stopped taking, states she is feeling overwhelmed again, will restart, has plenty of pills  no refill needed today f/u 6 mos or prn

## 2022-11-25 NOTE — Assessment & Plan Note (Signed)
chronic last LDL 122 01/2022 no meds, pt working on lifestyle modifications checking lipids today continue to educate on low saturated fat diet, exercise, weight loss f/u 1 yr

## 2022-11-26 LAB — CBC WITH DIFFERENTIAL/PLATELET
Absolute Monocytes: 651 cells/uL (ref 200–950)
Basophils Absolute: 37 cells/uL (ref 0–200)
Basophils Relative: 0.6 %
Eosinophils Absolute: 31 cells/uL (ref 15–500)
Eosinophils Relative: 0.5 %
HCT: 38.1 % (ref 35.0–45.0)
Hemoglobin: 11.9 g/dL (ref 11.7–15.5)
Lymphs Abs: 2015 cells/uL (ref 850–3900)
MCH: 24.5 pg — ABNORMAL LOW (ref 27.0–33.0)
MCHC: 31.2 g/dL — ABNORMAL LOW (ref 32.0–36.0)
MCV: 78.4 fL — ABNORMAL LOW (ref 80.0–100.0)
MPV: 11.1 fL (ref 7.5–12.5)
Monocytes Relative: 10.5 %
Neutro Abs: 3466 cells/uL (ref 1500–7800)
Neutrophils Relative %: 55.9 %
Platelets: 319 10*3/uL (ref 140–400)
RBC: 4.86 10*6/uL (ref 3.80–5.10)
RDW: 14.5 % (ref 11.0–15.0)
Total Lymphocyte: 32.5 %
WBC: 6.2 10*3/uL (ref 3.8–10.8)

## 2022-11-26 LAB — COMPREHENSIVE METABOLIC PANEL
AG Ratio: 1.7 (calc) (ref 1.0–2.5)
ALT: 13 U/L (ref 6–29)
AST: 14 U/L (ref 10–30)
Albumin: 4.4 g/dL (ref 3.6–5.1)
Alkaline phosphatase (APISO): 65 U/L (ref 31–125)
BUN: 12 mg/dL (ref 7–25)
CO2: 21 mmol/L (ref 20–32)
Calcium: 9.4 mg/dL (ref 8.6–10.2)
Chloride: 105 mmol/L (ref 98–110)
Creat: 0.84 mg/dL (ref 0.50–0.97)
Globulin: 2.6 g/dL (calc) (ref 1.9–3.7)
Glucose, Bld: 82 mg/dL (ref 65–99)
Potassium: 4.1 mmol/L (ref 3.5–5.3)
Sodium: 139 mmol/L (ref 135–146)
Total Bilirubin: 0.5 mg/dL (ref 0.2–1.2)
Total Protein: 7 g/dL (ref 6.1–8.1)

## 2022-11-26 LAB — LIPID PANEL
Cholesterol: 184 mg/dL (ref <200)
HDL: 48 mg/dL — ABNORMAL LOW (ref >=50)
LDL Cholesterol (Calc): 112 mg/dL (calc) — ABNORMAL HIGH
Non-HDL Cholesterol (Calc): 136 mg/dL (calc) — ABNORMAL HIGH (ref <130)
Total CHOL/HDL Ratio: 3.8 (calc) (ref <5.0)
Triglycerides: 128 mg/dL (ref <150)

## 2022-11-26 LAB — HEPATITIS C ANTIBODY: Hepatitis C Ab: NONREACTIVE

## 2022-11-28 ENCOUNTER — Other Ambulatory Visit (HOSPITAL_COMMUNITY): Payer: Self-pay

## 2022-12-01 ENCOUNTER — Other Ambulatory Visit (HOSPITAL_COMMUNITY): Payer: Self-pay

## 2022-12-23 ENCOUNTER — Other Ambulatory Visit (HOSPITAL_COMMUNITY): Payer: Self-pay

## 2022-12-26 ENCOUNTER — Encounter: Payer: Self-pay | Admitting: Family

## 2022-12-26 DIAGNOSIS — G43711 Chronic migraine without aura, intractable, with status migrainosus: Secondary | ICD-10-CM

## 2022-12-26 MED ORDER — QULIPTA 60 MG PO TABS
60.0000 mg | ORAL_TABLET | ORAL | 2 refills | Status: DC | PRN
Start: 2022-12-26 — End: 2023-04-21
  Filled 2022-12-26 – 2023-01-16 (×2): qty 30, 30d supply, fill #0

## 2022-12-26 NOTE — Telephone Encounter (Signed)
send message to PA team to start for Emgality and the Qulipta - send RX for Qulipta 60mg , one time dose as needed migraine, 10 pills with 2 refills. Let Girlene know, thx

## 2022-12-27 ENCOUNTER — Other Ambulatory Visit (HOSPITAL_COMMUNITY): Payer: Self-pay

## 2022-12-29 ENCOUNTER — Telehealth: Payer: Self-pay

## 2022-12-29 ENCOUNTER — Other Ambulatory Visit (HOSPITAL_COMMUNITY): Payer: Self-pay

## 2022-12-29 NOTE — Telephone Encounter (Signed)
Pharmacy Patient Advocate Encounter   Received notification from Physician's Office that prior authorization for Qulipta 60mg  tabs is required/requested.   Insurance verification completed.   The patient is insured through [facility] .   Per test claim: PA required; PA submitted to [facility] via CoverMyMeds Key/confirmation #/EOC [facility] Status is pending

## 2022-12-29 NOTE — Telephone Encounter (Signed)
Pharmacy Patient Advocate Encounter   Received notification from Physician's Office that prior authorization for Emgality is required/requested.   Insurance verification completed.   The patient is insured through [facility] .   Per test claim: PA required; PA submitted to [facility] via CoverMyMeds Key/confirmation #/EOC [id number] Status is pending

## 2023-01-02 ENCOUNTER — Other Ambulatory Visit (HOSPITAL_COMMUNITY): Payer: Self-pay

## 2023-01-02 NOTE — Telephone Encounter (Signed)
Pharmacy Patient Advocate Encounter  Received notification from [facility] that Prior Authorization for Qulipta 60mg  tabs has been DENIED.  Full denial letter will be uploaded to the media tab. See denial reason below.   PA #/Case ID/Reference #: [id number]     Please be advised we currently do not have a Pharmacist to review denials, therefore you will need to process appeals accordingly as needed. Thanks for your support at this time. Contact for appeals are as follows: Phone: [phone], Fax: [phone]

## 2023-01-02 NOTE — Telephone Encounter (Signed)
Pharmacy Patient Advocate Encounter  Received notification from [facility] that Prior Authorization for Emgality has been DENIED.  Full denial letter will be uploaded to the media tab. See denial reason below.   PA #/Case ID/Reference #: [id number]   Please be advised we currently do not have a Pharmacist to review denials, therefore you will need to process appeals accordingly as needed. Thanks for your support at this time. Contact for appeals are as follows: Phone: [phone], Fax: [phone]

## 2023-01-03 ENCOUNTER — Other Ambulatory Visit: Payer: Self-pay

## 2023-01-03 ENCOUNTER — Other Ambulatory Visit (HOSPITAL_COMMUNITY): Payer: Self-pay

## 2023-01-03 ENCOUNTER — Telehealth: Payer: Self-pay

## 2023-01-03 NOTE — Telephone Encounter (Signed)
Pharmacy Patient Advocate Encounter   Received notification from Patient Advice Request messages that prior authorization for Qulipta 60MG  tablets is required/requested.   Insurance verification completed.   The patient is insured through [facility] .   Per test claim: PA required; PA submitted to [facility] via Phone Key/confirmation #/EOC [id number] Status is pending    Phone # [phone]

## 2023-01-03 NOTE — Telephone Encounter (Signed)
Thanks

## 2023-01-06 ENCOUNTER — Encounter: Payer: Self-pay | Admitting: Family

## 2023-01-06 ENCOUNTER — Other Ambulatory Visit (HOSPITAL_COMMUNITY): Payer: Self-pay

## 2023-01-06 NOTE — Telephone Encounter (Signed)
I've made a note, we can discuss at her appt, thx

## 2023-01-11 ENCOUNTER — Other Ambulatory Visit (HOSPITAL_COMMUNITY): Payer: Self-pay

## 2023-01-13 NOTE — Telephone Encounter (Addendum)
Called insurance to check status. They did not see a new PA on file. Resubmitted PA via phone. Marked as urgent. Ref#: [id number]   Phone number: [phone]

## 2023-01-16 ENCOUNTER — Other Ambulatory Visit (HOSPITAL_COMMUNITY): Payer: Self-pay

## 2023-01-16 DIAGNOSIS — Z124 Encounter for screening for malignant neoplasm of cervix: Secondary | ICD-10-CM | POA: Diagnosis not present

## 2023-01-16 DIAGNOSIS — Z01419 Encounter for gynecological examination (general) (routine) without abnormal findings: Secondary | ICD-10-CM | POA: Diagnosis not present

## 2023-01-16 DIAGNOSIS — R87810 Cervical high risk human papillomavirus (HPV) DNA test positive: Secondary | ICD-10-CM | POA: Diagnosis not present

## 2023-01-16 MED ORDER — TRANEXAMIC ACID 650 MG PO TABS
1300.0000 mg | ORAL_TABLET | Freq: Three times a day (TID) | ORAL | 0 refills | Status: AC
  Filled 2023-01-16: qty 30, 5d supply, fill #0

## 2023-01-16 NOTE — Telephone Encounter (Signed)
Additional information has been requested from the patient's insurance in order to proceed with the prior authorization request. Requested information has been sent, or form has been filled out and faxed back to [phone]

## 2023-01-17 ENCOUNTER — Other Ambulatory Visit (HOSPITAL_COMMUNITY): Payer: Self-pay

## 2023-01-17 NOTE — Telephone Encounter (Signed)
Pharmacy Patient Advocate Encounter  Received notification from [facility] that Prior Authorization for Bennie Pierini has been APPROVED from 11.12.24 to 5.1..25. Ran test claim, Copay is $0.00. This test claim was processed through [facility]- copay amounts may vary at other pharmacies due to pharmacy/plan contracts, or as the patient moves through the different stages of their insurance plan.

## 2023-01-25 ENCOUNTER — Other Ambulatory Visit: Payer: Self-pay | Admitting: Family

## 2023-01-25 ENCOUNTER — Ambulatory Visit: Payer: 59 | Admitting: Family

## 2023-01-25 ENCOUNTER — Other Ambulatory Visit: Payer: Self-pay

## 2023-01-25 ENCOUNTER — Other Ambulatory Visit (HOSPITAL_COMMUNITY): Payer: Self-pay

## 2023-01-25 VITALS — BP 107/72 | HR 77 | Temp 97.3°F | Ht 66.5 in | Wt 243.4 lb

## 2023-01-25 DIAGNOSIS — H35413 Lattice degeneration of retina, bilateral: Secondary | ICD-10-CM

## 2023-01-25 DIAGNOSIS — H33312 Horseshoe tear of retina without detachment, left eye: Secondary | ICD-10-CM | POA: Diagnosis not present

## 2023-01-25 DIAGNOSIS — G43711 Chronic migraine without aura, intractable, with status migrainosus: Secondary | ICD-10-CM

## 2023-01-25 DIAGNOSIS — F411 Generalized anxiety disorder: Secondary | ICD-10-CM

## 2023-01-25 MED ORDER — EMGALITY 120 MG/ML ~~LOC~~ SOSY
120.0000 mg | PREFILLED_SYRINGE | SUBCUTANEOUS | 5 refills | Status: AC
Start: 2023-01-25 — End: 2023-03-05
  Filled 2023-01-25: qty 1, 30d supply, fill #0

## 2023-01-25 MED ORDER — NAPROXEN 500 MG PO TABS
500.0000 mg | ORAL_TABLET | ORAL | 5 refills | Status: AC
Start: 2023-01-25 — End: ?
  Filled 2023-01-25: qty 30, 30d supply, fill #0
  Filled 2023-12-15: qty 30, 30d supply, fill #1

## 2023-01-25 NOTE — Progress Notes (Signed)
Patient ID: Veronica Hines, female    DOB: 14-Aug-1990, 32 y.o.   MRN: [id number]  Chief Complaint  Patient presents with   Migraine    Pt here for f/u on Migraines, pt states no change, Metoprolol has not helped, Qulipta helps some, however pt can not take it frequently    Discussed the use of AI scribe software for clinical note transcription with the patient, who gave verbal consent to proceed.  History of Present Illness   The patient, with a history of frequent migraines, presents for a follow-up visit. She reports that the frequency of migraines has not significantly decreased despite the use of metoprolol for prevention. The patient estimates experiencing migraines about three times a week. However, she notes that the use of Bennie Pierini has been effective in managing the migraines when it occurs. The patient also reports that she has been taking ibuprofen in conjunction with the Qulipta.  Previously, the patient was on Elavil (amitriptyline), but this was discontinued due to daily headaches, some of which would progress to migraines. The patient also mentions a corneal issue in the right eye, which had been causing pain but has since resolved, but it still needs repair. She was seeing an ophthalmologist thru [facility], but needs a provider thru [facility] for her insurance to cover.     Assessment & Plan:     Migraine - Frequent migraines, approximately three per week. Metoprolol as a preventative measure has not been effective. Bennie Pierini has been effective for acute management. Patient has been using ibuprofen in conjunction with Qulipta. -Continue Qulipta as needed for acute migraines. -Replace ibuprofen with Naproxen to be taken with Qulipta. -Attempt to get Emgality covered by insurance as a preventative measure, previously denied as was sent same time as Qulipta (confusing as are for different purposes) -Continue Metoprolol until Emgality is obtained. -Follow-up in 4-6 months if no issues  arise.  Corneal issue - Patient reports a small corneal issue in the right eye, which caused pain last week but is currently asymptomatic. -Referral to a retinal specialist for further evaluation and management.     Subjective:    Outpatient Medications Prior to Visit  Medication Sig Dispense Refill   albuterol (VENTOLIN HFA) 108 (90 Base) MCG/ACT inhaler Inhale 2 puffs into the lungs every 6 (six) hours as needed.     Atogepant (QULIPTA) 60 MG TABS Take 1 tablet (60 mg total) by mouth as needed. one time dose as needed 10 tablet 2   clobetasol cream (TEMOVATE) 0.05 % APPLY A THIN LAYER TO THE AFFECTED AREA(S) BY TOPICAL ROUTE 2 TIMES PER DAY for up to 2 weeks     metoprolol succinate (TOPROL-XL) 25 MG 24 hr tablet Take 1 tablet (25 mg total) by mouth at bedtime. START with 1/2 pill for 1 week, if tolerated, increase to 1 full pill. 30 tablet 2   sertraline (ZOLOFT) 50 MG tablet Take 1 tablet (50 mg total) by mouth daily. 90 tablet 1   No facility-administered medications prior to visit.   Past Medical History:  Diagnosis Date   Abdominal pain, left lower quadrant 07/03/2012   Amniotic fluid leaking 07/25/2013   Anemia    Anxiety    Chlamydia    Family planning counseling 03/05/2012   GERD (gastroesophageal reflux disease)    Heart burn 02/27/2012   Migraine    Normal delivery 07/26/2013   Ovarian cyst    Supervision of normal first pregnancy in first trimester 12/25/2012   Trichomonal vaginitis  01/29/2013   History reviewed. No pertinent surgical history. No Known Allergies    Objective:    Physical Exam Vitals and nursing note reviewed.  Constitutional:      Appearance: Normal appearance. She is obese.  Cardiovascular:     Rate and Rhythm: Normal rate and regular rhythm.  Pulmonary:     Effort: Pulmonary effort is normal.     Breath sounds: Normal breath sounds.  Musculoskeletal:        General: Normal range of motion.  Skin:    General: Skin is warm and dry.   Neurological:     Mental Status: She is alert.  Psychiatric:        Mood and Affect: Mood normal.        Behavior: Behavior normal.    BP 107/72   Pulse 77   Temp (!) 97.3 F (36.3 C)   Ht 5' 6.5" (1.689 m)   Wt 243 lb 6.4 oz (110.4 kg)   SpO2 97%   BMI 38.70 kg/m  Wt Readings from Last 3 Encounters:  01/25/23 243 lb 6.4 oz (110.4 kg)  11/25/22 241 lb 12.8 oz (109.7 kg)  04/07/22 241 lb 9.6 oz (109.6 kg)       Dulce Sellar, NP

## 2023-01-25 NOTE — Assessment & Plan Note (Signed)
Frequent migraines, approximately three per week. Metoprolol as a preventative measure has not been effective. Veronica Hines has been effective for acute management. Patient has been using ibuprofen in conjunction with Qulipta. -Continue Qulipta as needed for acute migraines. -Replace ibuprofen with Naproxen to be taken with Qulipta. -Attempt to get Emgality covered by insurance as a preventative measure, previously denied as was sent same time as Qulipta (confusing as are for different purposes) -Continue Metoprolol until Emgality is obtained. -Follow-up in 4-6 months if no issues arise.

## 2023-01-26 ENCOUNTER — Other Ambulatory Visit (HOSPITAL_COMMUNITY): Payer: Self-pay

## 2023-01-26 MED ORDER — SERTRALINE HCL 50 MG PO TABS
50.0000 mg | ORAL_TABLET | Freq: Every day | ORAL | 1 refills | Status: DC
  Filled 2023-01-26: qty 90, 90d supply, fill #0

## 2023-01-27 ENCOUNTER — Other Ambulatory Visit (HOSPITAL_COMMUNITY): Payer: Self-pay

## 2023-01-30 ENCOUNTER — Telehealth: Payer: Self-pay

## 2023-01-30 NOTE — Telephone Encounter (Signed)
Prior Authorization form/request asks a question that requires your assistance. Please see the question below and advise accordingly.

## 2023-01-30 NOTE — Telephone Encounter (Signed)
Hey Ashleigh -yes, because she is using for episodic episodes, NOT prophlyactically! Will they approve her to take the Qulipta daily as prophylactic?! Or do I have to send it in daily first and get PA again?

## 2023-01-30 NOTE — Telephone Encounter (Signed)
-----   Message from Dulce Sellar sent at 01/25/2023 10:52 PM EST ----- Regarding: PA Can you please try to submit PA for Emgality again? pt has failed Metoprolol as preventative. Thanks

## 2023-01-31 NOTE — Telephone Encounter (Signed)
Please ask Veronica Hines if she is ok with taking the [place] daily as a preventative. We could try a lower dose. Because Veronica Hines is in the same class as Emgality they will not approve both at the same time. If she has any questions let me know.

## 2023-01-31 NOTE — Telephone Encounter (Signed)
Both Emgality (galcanezumab) and Qulipta (atogepant) are calcitonin gene-related peptide (CGRP) receptor antagonists indicated for migraine prevention. Concurrent use of these medications is not recommended, as they serve the same preventive function and should be used as monotherapy in combination with an abortive agent from a different drug class (e.g., triptans, NSAIDs) for effective migraine management. Insurance providers will not approve a prior authorization for both medications for the same patient.  Ms. Trakas currently has a prior authorization on file for daily use of Qulipta. Unless a switch to Emgality is preferred, we will not proceed with the prior authorization for Emgality at this time.  Dellie Burns, PharmD Clinical Pharmacist [place]  Direct Dial: [phone]

## 2023-02-03 ENCOUNTER — Other Ambulatory Visit (HOSPITAL_COMMUNITY): Payer: Self-pay

## 2023-02-03 ENCOUNTER — Other Ambulatory Visit: Payer: Self-pay

## 2023-02-07 NOTE — Telephone Encounter (Signed)
I called pt and lvm

## 2023-02-09 ENCOUNTER — Ambulatory Visit
Admission: RE | Admit: 2023-02-09 | Discharge: 2023-02-09 | Disposition: A | Discharge status: Home / Self Care | Payer: 59 | Source: Ambulatory Visit | Attending: Obstetrics and Gynecology | Admitting: Obstetrics and Gynecology

## 2023-02-09 DIAGNOSIS — N6489 Other specified disorders of breast: Secondary | ICD-10-CM

## 2023-02-09 DIAGNOSIS — R928 Other abnormal and inconclusive findings on diagnostic imaging of breast: Secondary | ICD-10-CM | POA: Diagnosis not present

## 2023-04-15 ENCOUNTER — Telehealth: Payer: 59 | Admitting: Urgent Care

## 2023-04-15 DIAGNOSIS — B9789 Other viral agents as the cause of diseases classified elsewhere: Secondary | ICD-10-CM

## 2023-04-15 DIAGNOSIS — J329 Chronic sinusitis, unspecified: Secondary | ICD-10-CM

## 2023-04-15 MED ORDER — PREDNISONE 50 MG PO TABS: 50.0000 mg | ORAL_TABLET | Freq: Every day | ORAL | 0 refills | Status: DC

## 2023-04-15 MED ORDER — IPRATROPIUM BROMIDE 0.03 % NA SOLN
2.0000 | Freq: Two times a day (BID) | NASAL | 0 refills | Status: DC
Start: 2023-04-15 — End: 2023-04-21

## 2023-04-15 NOTE — Progress Notes (Signed)
 E-Visit for Sinus Problems  We are sorry that you are not feeling well.  Here is how we plan to help!  Based on what you have shared with me it looks like you have sinusitis.  Sinusitis is inflammation and infection in the sinus cavities of the head.  Based on your presentation I believe you most likely have Acute Viral Sinusitis.This is an infection most likely caused by a virus. There is not specific treatment for viral sinusitis other than to help you with the symptoms until the infection runs its course.  You may use an oral decongestant such as Mucinex D or if you have glaucoma or high blood pressure use plain Mucinex. Saline nasal spray help and can safely be used as often as needed for congestion, I have prescribed: Ipratropium Bromide  nasal spray 0.03% 2 sprays in eah nostril 2-3 times a day. I have also called in prednisone  for you to take one tablet every day with breakfast. Do not take prior to bed or you will have trouble falling asleep.   Some authorities believe that zinc sprays or the use of Echinacea may shorten the course of your symptoms.  Sinus infections are not as easily transmitted as other respiratory infection, however we still recommend that you avoid close contact with loved ones, especially the very young and elderly.  Remember to wash your hands thoroughly throughout the day as this is the number one way to prevent the spread of infection!  Home Care: Only take medications as instructed by your medical team. Do not take these medications with alcohol. A steam or ultrasonic humidifier can help congestion.  You can place a towel over your head and breathe in the steam from hot water coming from a faucet. Avoid close contacts especially the very young and the elderly. Cover your mouth when you cough or sneeze. Always remember to wash your hands.  Get Help Right Away If: You develop worsening fever or sinus pain. You develop a severe head ache or visual changes. Your  symptoms persist after you have completed your treatment plan.  Make sure you Understand these instructions. Will watch your condition. Will get help right away if you are not doing well or get worse.   Thank you for choosing an e-visit.  Your e-visit answers were reviewed by a board certified advanced clinical practitioner to complete your personal care plan. Depending upon the condition, your plan could have included both over the counter or prescription medications.  Please review your pharmacy choice. Make sure the pharmacy is open so you can pick up prescription now. If there is a problem, you may contact your provider through [employer] and have the prescription routed to another pharmacy.  Your safety is important to us . If you have drug allergies check your prescription carefully.   For the next 24 hours you can use MyChart to ask questions about today's visit, request a non-urgent call back, or ask for a work or school excuse. You will get an email in the next two days asking about your experience. I hope that your e-visit has been valuable and will speed your recovery.    I have spent 5 minutes in review of e-visit questionnaire, review and updating patient chart, medical decision making and response to patient.   Benton LITTIE Gave, PA

## 2023-04-21 ENCOUNTER — Telehealth (INDEPENDENT_AMBULATORY_CARE_PROVIDER_SITE_OTHER): Payer: 59 | Admitting: Family

## 2023-04-21 ENCOUNTER — Encounter: Payer: Self-pay | Admitting: Family

## 2023-04-21 ENCOUNTER — Other Ambulatory Visit (HOSPITAL_COMMUNITY): Payer: Self-pay

## 2023-04-21 ENCOUNTER — Other Ambulatory Visit: Payer: Self-pay | Admitting: Family

## 2023-04-21 DIAGNOSIS — G43711 Chronic migraine without aura, intractable, with status migrainosus: Secondary | ICD-10-CM

## 2023-04-21 DIAGNOSIS — F411 Generalized anxiety disorder: Secondary | ICD-10-CM

## 2023-04-21 MED ORDER — QULIPTA 60 MG PO TABS
60.0000 mg | ORAL_TABLET | ORAL | 5 refills | Status: DC | PRN
  Filled 2023-04-21: qty 30, 30d supply, fill #0
  Filled 2023-08-17: qty 30, 30d supply, fill #1

## 2023-04-21 MED ORDER — SERTRALINE HCL 50 MG PO TABS
50.0000 mg | ORAL_TABLET | Freq: Every day | ORAL | 1 refills | Status: DC
Start: 2023-04-21 — End: 2023-10-18
  Filled 2023-04-21: qty 90, 90d supply, fill #0

## 2023-04-21 NOTE — Progress Notes (Signed)
MyChart Video Visit    Virtual Visit via Video Note   This format is felt to be most appropriate for this patient at this time. Physical exam was limited by quality of the video and audio technology used for the visit. CMA was able to get the patient set up on a video visit.  Patient location: Home. Patient and provider in visit Provider location: Office  I discussed the limitations of evaluation and management by telemedicine and the availability of in person appointments. The patient expressed understanding and agreed to proceed.  Visit Date: 04/21/2023  Today's healthcare provider: Dulce Sellar, NP     Subjective:   Patient ID: Veronica Hines, female    DOB: 1990/06/19, 33 y.o.   MRN: [id number]  Chief Complaint  Patient presents with   Migraine   HPI: Migraines:  The patient, with a history of frequent migraines, presents for a follow-up visit. She reports that the frequency of migraines is back up to about 3 per week as she started having abdominal pain with the Metoprolol qhs and stopped taking. Previously, it did help with migraine prevention. She is taking Qulipta and Naproxen at first sign of migraine, which has been effective in managing the migraines when they occur. She was not approved for Emgality injections (prophylactic tx), as it was sent with the [place] (acute tx) and insurance would not approve both. Have since learned that Bennie Pierini is also approved for qd use as prophylactic tx.   Anxiety:  pt reports her anxiety is bad when she gets overwhelmed. 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 She has been on & off Zoloft which works well for her when taking regularly.   Assessment & Plan:  Intractable chronic migraine without aura and with status migrainosus Assessment & Plan: Frequent migraines, approximately three per week. Metoprolol as a preventative measure helped, but has since caused stomach pain/upset so  she stopped. Bennie Pierini has been effective for acute management. Patient has been using Naproxen in conjunction with Qulipta. -Continue Qulipta 60mg  as needed for acute migraines. -Ins. would not cover Emgality injections (prophylactic tx), as it was sent with the Qulipta (acute tx) and insurance would not approve both. Have since learned that Bennie Pierini is also approved for qd use as prophylactic tx and why they would not cover both. -Advised to cut the Metoprolol in half (12.5mg ) and try this qhs & see if helps prevent migraine but not cause stomach upset. -If above does not work, can either take [employer] daily (start w/30mg ) for preventive or try to get Emgality monthly injections approved again. -F/U 6mos or sooner   Orders: Bennie Pierini; Take 1 tablet (60 mg total) by mouth as needed. one time dose as needed  Dispense: 10 tablet; Refill: 5  Generalized anxiety disorder Assessment & Plan: chronic - pt reports sx starting when her mother dx w/breast cancer, now taking care of her daily  taking 50mg  zoloft qd, working well, no SE f/u 6 mos or prn   Orders: -     Sertraline HCl; Take 1 tablet (50 mg total) by mouth daily.  Dispense: 90 tablet; Refill: 1   Past Medical History:  Diagnosis Date   Abdominal pain, left lower quadrant 07/03/2012   Amniotic fluid leaking 07/25/2013   Anemia    Anxiety    Chlamydia    Family planning counseling 03/05/2012   GERD (gastroesophageal reflux disease)  Heart burn 02/27/2012   Migraine    Normal delivery 07/26/2013   Ovarian cyst    Supervision of normal first pregnancy in first trimester 12/25/2012   Trichomonal vaginitis 01/29/2013    No past surgical history on file.  Outpatient Medications Prior to Visit  Medication Sig Dispense Refill   albuterol (VENTOLIN HFA) 108 (90 Base) MCG/ACT inhaler Inhale 2 puffs into the lungs every 6 (six) hours as needed.     clobetasol cream (TEMOVATE) 0.05 % APPLY A THIN LAYER TO THE AFFECTED AREA(S) BY  TOPICAL ROUTE 2 TIMES PER DAY for up to 2 weeks     metoprolol succinate (TOPROL-XL) 25 MG 24 hr tablet Take 1 tablet (25 mg total) by mouth at bedtime. START with 1/2 pill for 1 week, if tolerated, increase to 1 full pill. 30 tablet 2   naproxen (NAPROSYN) 500 MG tablet Take 1 tablet (500 mg total) by mouth as directed. Take at first sign of headache/migraine with the Qulipta. Can take another Naproxen pill again in 8 hours if needed. 30 tablet 5   Atogepant (QULIPTA) 60 MG TABS Take 1 tablet (60 mg total) by mouth as needed. one time dose as needed 10 tablet 2   predniSONE (DELTASONE) 50 MG tablet Take 1 tablet (50 mg total) by mouth daily with breakfast. 5 tablet 0   sertraline (ZOLOFT) 50 MG tablet Take 1 tablet (50 mg total) by mouth daily. 90 tablet 1   ipratropium (ATROVENT) 0.03 % nasal spray Place 2 sprays into both nostrils every 12 (twelve) hours. 30 mL 0   No facility-administered medications prior to visit.    No Known Allergies     Objective:   Physical Exam Vitals and nursing note reviewed.  Constitutional:      General: Pt is not in acute distress.    Appearance: Normal appearance.  HENT:     Head: Normocephalic.  Pulmonary:     Effort: No respiratory distress.  Musculoskeletal:     Cervical back: Normal range of motion.  Skin:    General: Skin is dry.     Coloration: Skin is not pale.  Neurological:     Mental Status: Pt is alert and oriented to person, place, and time.  Psychiatric:        Mood and Affect: Mood normal.   There were no vitals taken for this visit.  Wt Readings from Last 3 Encounters:  01/25/23 243 lb 6.4 oz (110.4 kg)  11/25/22 241 lb 12.8 oz (109.7 kg)  04/07/22 241 lb 9.6 oz (109.6 kg)      I discussed the assessment and treatment plan with the patient. The patient was provided an opportunity to ask questions and all were answered. The patient agreed with the plan and demonstrated an understanding of the instructions.   The patient was  advised to call back or seek an in-person evaluation if the symptoms worsen or if the condition fails to improve as anticipated.  Dulce Sellar, NP [facility] at [facility] [phone] (phone) [phone] (fax)  [facility]

## 2023-04-21 NOTE — Assessment & Plan Note (Signed)
chronic - pt reports sx starting when her mother dx w/breast cancer, now taking care of her daily  taking 50mg  zoloft qd, working well, no SE f/u 6 mos or prn

## 2023-04-21 NOTE — Assessment & Plan Note (Signed)
Frequent migraines, approximately three per week. Metoprolol as a preventative measure helped, but has since caused stomach pain/upset so she stopped. Bennie Pierini has been effective for acute management. Patient has been using Naproxen in conjunction with Qulipta. -Continue Qulipta 60mg  as needed for acute migraines. -Ins. would not cover Emgality injections (prophylactic tx), as it was sent with the Qulipta (acute tx) and insurance would not approve both. Have since learned that Bennie Pierini is also approved for qd use as prophylactic tx and why they would not cover both. -Advised to cut the Metoprolol in half (12.5mg ) and try this qhs & see if helps prevent migraine but not cause stomach upset. -If above does not work, can either take [employer] daily (start w/30mg ) for preventive or try to get Emgality monthly injections approved again. -F/U 6mos or sooner

## 2023-04-27 ENCOUNTER — Telehealth: Payer: 59

## 2023-04-27 DIAGNOSIS — B9689 Other specified bacterial agents as the cause of diseases classified elsewhere: Secondary | ICD-10-CM

## 2023-04-27 DIAGNOSIS — N760 Acute vaginitis: Secondary | ICD-10-CM

## 2023-04-28 ENCOUNTER — Other Ambulatory Visit (HOSPITAL_COMMUNITY): Payer: Self-pay

## 2023-04-28 MED ORDER — METRONIDAZOLE 500 MG PO TABS
500.0000 mg | ORAL_TABLET | Freq: Two times a day (BID) | ORAL | 0 refills | Status: AC
  Filled 2023-04-28: qty 14, 7d supply, fill #0

## 2023-04-28 NOTE — Progress Notes (Signed)

## 2023-04-29 ENCOUNTER — Other Ambulatory Visit (HOSPITAL_COMMUNITY): Payer: Self-pay

## 2023-05-01 ENCOUNTER — Other Ambulatory Visit (HOSPITAL_COMMUNITY): Payer: Self-pay

## 2023-06-02 DIAGNOSIS — N898 Other specified noninflammatory disorders of vagina: Secondary | ICD-10-CM | POA: Diagnosis not present

## 2023-06-02 DIAGNOSIS — N899 Noninflammatory disorder of vagina, unspecified: Secondary | ICD-10-CM | POA: Diagnosis not present

## 2023-06-06 ENCOUNTER — Other Ambulatory Visit (HOSPITAL_COMMUNITY): Payer: Self-pay

## 2023-06-06 MED ORDER — METRONIDAZOLE 0.75 % VA GEL
VAGINAL | 1 refills | Status: DC
Start: 2023-06-06 — End: 2023-10-18
  Filled 2023-06-06: qty 70, 84d supply, fill #0
  Filled 2023-10-07: qty 70, 84d supply, fill #1

## 2023-06-06 MED ORDER — METRONIDAZOLE 500 MG PO TABS
500.0000 mg | ORAL_TABLET | Freq: Two times a day (BID) | ORAL | 0 refills | Status: DC
Start: 2023-06-06 — End: 2023-07-20
  Filled 2023-06-06: qty 14, 7d supply, fill #0

## 2023-06-19 ENCOUNTER — Ambulatory Visit: Payer: 59 | Admitting: Family

## 2023-07-20 ENCOUNTER — Ambulatory Visit: Admitting: Family

## 2023-07-20 ENCOUNTER — Other Ambulatory Visit (HOSPITAL_COMMUNITY): Payer: Self-pay

## 2023-07-20 VITALS — BP 126/84 | HR 76 | Temp 98.1°F | Ht 66.5 in | Wt 235.8 lb

## 2023-07-20 DIAGNOSIS — R350 Frequency of micturition: Secondary | ICD-10-CM | POA: Diagnosis not present

## 2023-07-20 DIAGNOSIS — R051 Acute cough: Secondary | ICD-10-CM | POA: Diagnosis not present

## 2023-07-20 DIAGNOSIS — N920 Excessive and frequent menstruation with regular cycle: Secondary | ICD-10-CM

## 2023-07-20 LAB — POCT URINALYSIS DIPSTICK
Bilirubin, UA: NEGATIVE
Blood, UA: NEGATIVE
Glucose, UA: NEGATIVE
Ketones, UA: NEGATIVE
Leukocytes, UA: NEGATIVE
Nitrite, UA: NEGATIVE
Protein, UA: NEGATIVE
Spec Grav, UA: >=1.03 — AB (ref 1.010–1.025)
Urobilinogen, UA: 0.2 U/dL
pH, UA: 6 (ref 5.0–8.0)

## 2023-07-20 MED ORDER — BENZONATATE 100 MG PO CAPS
100.0000 mg | ORAL_CAPSULE | Freq: Three times a day (TID) | ORAL | 0 refills | Status: AC | PRN
Start: 2023-07-20 — End: 2023-07-30
  Filled 2023-07-20: qty 30, 5d supply, fill #0

## 2023-07-20 NOTE — Progress Notes (Signed)
 Patient ID: Veronica Hines, female    DOB: January 12, 1991, 33 y.o.   MRN: [id number]  Chief Complaint  Patient presents with   Urinary Frequency    Pt c/o urinary frequency and lower back, present since Sunday.    Menstrual Problem    Pt c/o pelvic pain and longer menstrual cycles, Present for 2-3 months.   Discussed the use of AI scribe software for clinical note transcription with the patient, who gave verbal consent to proceed.  History of Present Illness Veronica Hines is a 33 year old female who presents with a persistent cough and menstrual irregularities.  She has experienced a persistent cough for the past week, initially with throat irritation, dryness, and a burning sensation. The cough worsened from Sunday to yesterday, with some improvement today, but intensifies at night. Coughing spells are difficult to stop, and she feels unable to take a deep breath, though she does not feel short of breath. Albuterol provides some relief. She also had nasal congestion, chills, sweats, and a negative COVID test. Yellow mucus production began yesterday.  She has concerns about her menstrual cycle, which has become irregular, longer, and heavier with significant clotting. Pelvic pain is present in the ovary area. She called her GYN but unable to be seen until July. She has a history of ovarian cysts and ruptures. She notes the development of chin hair and increased acne. She reports increased fatigue and unchanged headache frequency, managed with Qulipta  after stopping metoprolol .  Assessment & Plan Acute upper respiratory infection Acute upper respiratory infection with cough, nasal congestion, and yellow sputum for one week. Symptoms improving. Negative COVID test. Albuterol provides relief. Cough worsens at night. - Continue albuterol inhaler as needed, especially before bedtime. Lungs clear on exam. - Prescribe Tessalon  Perles to decrease cough intensity and frequency. Discussed potential for  slight dizziness. - Maintain adequate hydration, at least 2L per day. - Call back if sx are not improved by next week.  Menorrhagia w/cramping - Menorrhagia with heavier, longer cycles and clotting. Regular cycles now altered. Previous CBC showed slight anemia. Increased fatigue possibly related to menorrhagia and potential anemia. Pelvic pain in the ovary area with hx of ovarian cysts and ruptures, also c/o hirsutism, concerns for possible PCOS. - Recommend ibuprofen  up to 800mg  tid during menstrual cycles for cramp relief and to help slow bleeding. - Order transvaginal ultrasound to assess for ovarian cysts or fibroids, possible PCOS. - Schedule CBC to check for anemia for tomorrow or Monday. - Consider progesterone pills during the menstrual cycle to slow bleeding if needed. - Recommend follow-up with gynecologist for further evaluation and management.  Urinary frequency -  With low back pain. Denies any hematuria, pelvic pain, foul odor, urine cloudiness, or dysuria. UA negative.  - Continue to hydrate well daily, 2L water.  Subjective:     Outpatient Medications Prior to Visit  Medication Sig Dispense Refill   albuterol (VENTOLIN HFA) 108 (90 Base) MCG/ACT inhaler Inhale 2 puffs into the lungs every 6 (six) hours as needed.     Atogepant  (QULIPTA ) 60 MG TABS Take 1 tablet (60 mg total) by mouth as needed. one time dose as needed 10 tablet 5   clobetasol cream (TEMOVATE) 0.05 % APPLY A THIN LAYER TO THE AFFECTED AREA(S) BY TOPICAL ROUTE 2 TIMES PER DAY for up to 2 weeks     metroNIDAZOLE  (METROGEL ) 0.75 % vaginal gel Insert vaginally twice weekly for 6 months as directed 70 g 1  naproxen  (NAPROSYN ) 500 MG tablet Take 1 tablet (500 mg total) by mouth as directed. Take at first sign of headache/migraine with the Qulipta . Can take another Naproxen  pill again in 8 hours if needed. 30 tablet 5   sertraline  (ZOLOFT ) 50 MG tablet Take 1 tablet (50 mg total) by mouth daily. 90 tablet 1    metoprolol  succinate (TOPROL -XL) 25 MG 24 hr tablet Take 1 tablet (25 mg total) by mouth at bedtime. START with 1/2 pill for 1 week, if tolerated, increase to 1 full pill. (Patient not taking: Reported on 07/20/2023) 30 tablet 2   metroNIDAZOLE  (FLAGYL ) 500 MG tablet Take 1 tablet (500 mg total) by mouth 2 (two) times daily for 7 days (Patient not taking: Reported on 07/20/2023) 14 tablet 0   No facility-administered medications prior to visit.   Past Medical History:  Diagnosis Date   Abdominal pain, left lower quadrant 07/03/2012   Amniotic fluid leaking 07/25/2013   Anemia    Anxiety    Chlamydia    Family planning counseling 03/05/2012   GERD (gastroesophageal reflux disease)    Heart burn 02/27/2012   Migraine    Normal delivery 07/26/2013   Ovarian cyst    Supervision of normal first pregnancy in first trimester 12/25/2012   Trichomonal vaginitis 01/29/2013   No past surgical history on file. No Known Allergies    Objective:    Physical Exam Vitals and nursing note reviewed.  Constitutional:      Appearance: Normal appearance. She is obese. She is not ill-appearing.     Interventions: Face mask in place.  HENT:     Right Ear: Tympanic membrane and ear canal normal.     Left Ear: Tympanic membrane and ear canal normal.     Nose:     Right Sinus: No frontal sinus tenderness.     Left Sinus: No frontal sinus tenderness.     Mouth/Throat:     Mouth: Mucous membranes are moist.     Pharynx: No pharyngeal swelling, oropharyngeal exudate, posterior oropharyngeal erythema or uvula swelling.     Tonsils: No tonsillar exudate or tonsillar abscesses.  Cardiovascular:     Rate and Rhythm: Normal rate and regular rhythm.  Pulmonary:     Effort: Pulmonary effort is normal.     Breath sounds: Normal breath sounds.  Musculoskeletal:        General: Normal range of motion.  Lymphadenopathy:     Head:     Right side of head: No preauricular or posterior auricular adenopathy.      Left side of head: No preauricular or posterior auricular adenopathy.     Cervical: No cervical adenopathy.  Skin:    General: Skin is warm and dry.  Neurological:     Mental Status: She is alert.  Psychiatric:        Mood and Affect: Mood normal.        Behavior: Behavior normal.    BP 126/84 (BP Location: Left Arm, Patient Position: Sitting, Cuff Size: Large)   Pulse 76   Temp 98.1 F (36.7 C) (Temporal)   Ht 5' 6.5" (1.689 m)   Wt 235 lb 12.8 oz (107 kg)   LMP 07/04/2023 (Exact Date)   SpO2 93%   BMI 37.49 kg/m  Wt Readings from Last 3 Encounters:  07/20/23 235 lb 12.8 oz (107 kg)  01/25/23 243 lb 6.4 oz (110.4 kg)  11/25/22 241 lb 12.8 oz (109.7 kg)      Versa Gore, NP

## 2023-07-21 ENCOUNTER — Other Ambulatory Visit

## 2023-07-21 DIAGNOSIS — N920 Excessive and frequent menstruation with regular cycle: Secondary | ICD-10-CM | POA: Diagnosis not present

## 2023-07-21 LAB — CBC WITH DIFFERENTIAL/PLATELET
Absolute Lymphocytes: 2405 {cells}/uL (ref 850–3900)
Absolute Monocytes: 688 {cells}/uL (ref 200–950)
Basophils Absolute: 52 {cells}/uL (ref 0–200)
Basophils Relative: 0.7 %
Eosinophils Absolute: 192 {cells}/uL (ref 15–500)
Eosinophils Relative: 2.6 %
HCT: 37.7 % (ref 35.0–45.0)
Hemoglobin: 11.8 g/dL (ref 11.7–15.5)
MCH: 24.4 pg — ABNORMAL LOW (ref 27.0–33.0)
MCHC: 31.3 g/dL — ABNORMAL LOW (ref 32.0–36.0)
MCV: 77.9 fL — ABNORMAL LOW (ref 80.0–100.0)
MPV: 10.8 fL (ref 7.5–12.5)
Monocytes Relative: 9.3 %
Neutro Abs: 4063 {cells}/uL (ref 1500–7800)
Neutrophils Relative %: 54.9 %
Platelets: 308 10*3/uL (ref 140–400)
RBC: 4.84 10*6/uL (ref 3.80–5.10)
RDW: 14.3 % (ref 11.0–15.0)
Total Lymphocyte: 32.5 %
WBC: 7.4 10*3/uL (ref 3.8–10.8)

## 2023-07-21 NOTE — Addendum Note (Signed)
 Addended by: Avelina Leitz on: 07/21/2023 03:07 PM   Modules accepted: Orders

## 2023-07-24 ENCOUNTER — Ambulatory Visit: Payer: Self-pay | Admitting: Family

## 2023-08-01 ENCOUNTER — Ambulatory Visit (HOSPITAL_COMMUNITY)

## 2023-08-01 ENCOUNTER — Encounter (HOSPITAL_COMMUNITY): Payer: Self-pay

## 2023-08-01 ENCOUNTER — Other Ambulatory Visit (HOSPITAL_COMMUNITY): Payer: Self-pay

## 2023-08-17 ENCOUNTER — Other Ambulatory Visit: Payer: Self-pay

## 2023-08-17 ENCOUNTER — Other Ambulatory Visit (HOSPITAL_COMMUNITY): Payer: Self-pay

## 2023-08-25 ENCOUNTER — Other Ambulatory Visit (HOSPITAL_COMMUNITY): Payer: Self-pay

## 2023-08-28 ENCOUNTER — Telehealth: Payer: Self-pay

## 2023-08-28 ENCOUNTER — Other Ambulatory Visit (HOSPITAL_COMMUNITY): Payer: Self-pay

## 2023-08-28 NOTE — Telephone Encounter (Signed)
 Pharmacy Patient Advocate Encounter   Received notification from Onbase that prior authorization for QULIPTA  60MG  TABS  is required/requested.   Insurance verification completed.   The patient is insured through [facility] .   Per test claim: PA required; PA submitted to above mentioned insurance via CoverMyMeds Key/confirmation #/EOC [id number] Status is pending

## 2023-08-28 NOTE — Telephone Encounter (Signed)
 Pharmacy Patient Advocate Encounter  Received notification from [employer] that Prior Authorization for QULIPTA  60 TABS has been APPROVED from 08/28/23 to 02/27/24   PA #/Case ID/Reference #: [id number]

## 2023-08-30 ENCOUNTER — Other Ambulatory Visit (HOSPITAL_COMMUNITY): Payer: Self-pay

## 2023-10-18 ENCOUNTER — Ambulatory Visit: Admitting: Family Medicine

## 2023-10-18 ENCOUNTER — Other Ambulatory Visit (HOSPITAL_COMMUNITY): Payer: Self-pay

## 2023-10-18 ENCOUNTER — Encounter: Payer: Self-pay | Admitting: Family Medicine

## 2023-10-18 VITALS — BP 104/68 | HR 87 | Resp 16 | Ht 68.0 in | Wt 239.0 lb

## 2023-10-18 DIAGNOSIS — F411 Generalized anxiety disorder: Secondary | ICD-10-CM | POA: Diagnosis not present

## 2023-10-18 DIAGNOSIS — R7989 Other specified abnormal findings of blood chemistry: Secondary | ICD-10-CM

## 2023-10-18 DIAGNOSIS — G43009 Migraine without aura, not intractable, without status migrainosus: Secondary | ICD-10-CM

## 2023-10-18 DIAGNOSIS — E611 Iron deficiency: Secondary | ICD-10-CM

## 2023-10-18 DIAGNOSIS — G43711 Chronic migraine without aura, intractable, with status migrainosus: Secondary | ICD-10-CM

## 2023-10-18 DIAGNOSIS — E669 Obesity, unspecified: Secondary | ICD-10-CM

## 2023-10-18 MED ORDER — PHENTERMINE HCL 37.5 MG PO CAPS
37.5000 mg | ORAL_CAPSULE | ORAL | 0 refills | Status: DC
  Filled 2023-10-18 (×2): qty 90, 90d supply, fill #0

## 2023-10-18 MED ORDER — QULIPTA 60 MG PO TABS
60.0000 mg | ORAL_TABLET | Freq: Every day | ORAL | 11 refills | Status: DC
  Filled 2023-10-18 (×2): qty 30, 30d supply, fill #0
  Filled 2023-12-15: qty 30, 30d supply, fill #1
  Filled 2024-01-11 – 2024-01-28 (×2): qty 30, 30d supply, fill #2
  Filled 2024-03-06: qty 30, 30d supply, fill #3

## 2023-10-18 NOTE — Progress Notes (Signed)
 New patient visit   Patient: Veronica Hines   DOB: Feb 24, 1991   33 y.o. Female  MRN: [id number] Visit Date: 10/18/2023  Today's healthcare provider: Rockie Agent, MD   Chief Complaint  Patient presents with   New Patient (Initial Visit)    NP/Est Care    Subjective    Veronica Hines is a 33 y.o. female who presents today as a new patient to establish care.   HPI     New Patient (Initial Visit)    Additional comments: NP/Est Care       Last edited by Marylen Odella LITTIE, CMA on 10/18/2023  3:21 PM.       Discussed the use of AI scribe software for clinical note transcription with the patient, who gave verbal consent to proceed.  History of Present Illness Veronica Hines is a 33 year old female who presents to establish care as a new patient.  She has a history of migraines since her youth, previously managed by a neurologist. She has been trying various medications to manage her migraines, including Qulipta  60 mg as needed, which is effective in reducing the frequency of her migraines from four to five times a week to two to three times a week. She has tried other medications such as amitriptyline , ibuprofen , naproxen , Topamax, and propranolol, but these were either ineffective or had undesirable side effects. She experiences hormonal migraines but no longer has an aura.  She has a history of elevated LDL cholesterol and obesity, with a BMI of 36.34. Her weight has been stable around 230-240 pounds despite efforts in diet and exercise. She has been trying to manage her weight through walking, home exercises, and dietary changes, including intermittent fasting and reduced portion sizes. She has a history of trying phentermine  for weight loss, which was effective, but her previous provider was not supportive of continuing it.  She reports a history of anxiety, which was primarily triggered by her mother's cancer diagnosis two years ago. Her mother had breast and  vulvar cancer and is now in remission but has other health issues like Graves' disease and seizures. She was taking Zoloft  50 mg daily for anxiety but has since stopped as her anxiety has decreased.  She has a history of recurrent bacterial vaginosis, particularly after her menstrual cycle, and has used metronidazole  gel and pills for treatment. The gel was not effective, but the pills were. She also experiences vaginal itching, for which she has clobetasol cream 0.5% on hand, used as needed.  She has a history of heavy menstrual periods and ovarian cysts, with recent changes in her menstrual cycle length and duration. She is concerned about possible PCOS due to symptoms like facial hair growth and difficulty losing weight. She has an upcoming appointment with a new gynecologist to address these concerns.  She has a history of iron  deficiency and takes iron  supplements, particularly around her menstrual cycle. Her MCV was 77.9 in May. She has not been tested for thyroid abnormalities recently.      Past Medical History:  Diagnosis Date   Abdominal pain, left lower quadrant 07/03/2012   Amniotic fluid leaking 07/25/2013   Anemia    Anxiety    Chlamydia    Family planning counseling 03/05/2012   GERD (gastroesophageal reflux disease)    Heart burn 02/27/2012   Migraine    Normal delivery 07/26/2013   Ovarian cyst    Supervision of normal first pregnancy in first trimester 12/25/2012  Trichomonal vaginitis 01/29/2013    Outpatient Medications Prior to Visit  Medication Sig   albuterol (VENTOLIN HFA) 108 (90 Base) MCG/ACT inhaler Inhale 2 puffs into the lungs every 6 (six) hours as needed.   clobetasol cream (TEMOVATE) 0.05 % APPLY A THIN LAYER TO THE AFFECTED AREA(S) BY TOPICAL ROUTE 2 TIMES PER DAY for up to 2 weeks   naproxen  (NAPROSYN ) 500 MG tablet Take 1 tablet (500 mg total) by mouth as directed. Take at first sign of headache/migraine with the Qulipta . Can take another Naproxen   pill again in 8 hours if needed.   [DISCONTINUED] Atogepant  (QULIPTA ) 60 MG TABS Take 1 tablet (60 mg total) by mouth as needed. one time dose as needed   [DISCONTINUED] metroNIDAZOLE  (METROGEL ) 0.75 % vaginal gel Insert vaginally twice weekly for 6 months as directed   [DISCONTINUED] sertraline  (ZOLOFT ) 50 MG tablet Take 1 tablet (50 mg total) by mouth daily.   [DISCONTINUED] metoprolol  succinate (TOPROL -XL) 25 MG 24 hr tablet Take 1 tablet (25 mg total) by mouth at bedtime. START with 1/2 pill for 1 week, if tolerated, increase to 1 full pill. (Patient not taking: Reported on 10/18/2023)   No facility-administered medications prior to visit.    History reviewed. No pertinent surgical history. Family Status  Relation Name Status   Mother Librarian, academic (Not Specified)   Father  (Not Specified)   Mat Aunt  (Not Specified)   Mat Uncle  (Not Specified)   Bruna Nyhan  (Not Specified)   Bruna Brigham  (Not Specified)   MGM Dickey sharps (Not Specified)   MGF Debby Dinsmore (Not Specified)  No partnership data on file   Family History  Problem Relation Age of Onset   Breast cancer Mother    Depression Mother    Hypertension Mother    Anxiety disorder Mother    Bipolar disorder Mother    Cancer Mother    Bipolar disorder Father    Hypertension Maternal Aunt    Hypertension Maternal Uncle    Diabetes Maternal Uncle    Hypertension Paternal Aunt    Diabetes Paternal Aunt    Hypertension Paternal Uncle    Diabetes Paternal Uncle    Diabetes Maternal Grandmother    Cancer Maternal Grandfather        lung   Social History   Socioeconomic History   Marital status: Single    Spouse name: Not on file   Number of children: Not on file   Years of education: Not on file   Highest education level: Associate degree: occupational, Scientist, product/process development, or vocational program  Occupational History   Not on file  Tobacco Use   Smoking status: Former    Types: Cigars   Smokeless tobacco: Never  Vaping Use    Vaping status: Never Used  Substance and Sexual Activity   Alcohol use: Never   Drug use: Never   Sexual activity: Yes    Birth control/protection: None  Other Topics Concern   Not on file  Social History Narrative   Not on file   Social Drivers of Health   Financial Resource Strain: Low Risk  (10/16/2023)   Overall Financial Resource Strain (CARDIA)    Difficulty of Paying Living Expenses: Not hard at all  Food Insecurity: No Food Insecurity (10/16/2023)   Hunger Vital Sign    Worried About Running Out of Food in the Last Year: Never true    Ran Out of Food in the Last Year: Never true  Transportation Needs:  No Transportation Needs (10/16/2023)   PRAPARE - Administrator, Civil Service (Medical): No    Lack of Transportation (Non-Medical): No  Physical Activity: Insufficiently Active (10/16/2023)   Exercise Vital Sign    Days of Exercise per Week: 4 days    Minutes of Exercise per Session: 30 min  Stress: No Stress Concern Present (10/16/2023)   [employer] of Occupational Health - Occupational Stress Questionnaire    Feeling of Stress: Not at all  Social Connections: Unknown (10/16/2023)   Social Connection and Isolation Panel    Frequency of Communication with Friends and Family: More than three times a week    Frequency of Social Gatherings with Friends and Family: Once a week    Attends Religious Services: More than 4 times per year    Active Member of Clubs or Organizations: No    Attends Banker Meetings: Not on file    Marital Status: Patient declined     No Known Allergies  Immunization History  Administered Date(s) Administered   DTaP 12/16/1991, 03/13/1992, 05/08/1992, 01/14/1993   HIB (PRP-T) 12/16/1991, 03/13/1992, 05/08/1992, 01/14/1993   HPV Quadrivalent 09/19/2005, 04/06/2006, 05/28/2007, 09/04/2012   Hep B, Unspecified 09/04/2009   Hepatitis A, Ped/Adol-2 Dose 09/19/2005, 05/28/2007   Hepatitis B, PED/ADOLESCENT 07/13/91,  12/16/1991, 05/08/1992   IPV 12/16/1991, 03/13/1992, 05/08/1992, 07/10/2008   Influenza Inj Mdck Quad With Preservative 12/06/2018   Influenza, Seasonal, Injecte, Preservative Fre 11/25/2022   Influenza,inj,Quad PF,6+ Mos 01/29/2013, 12/22/2017, 12/28/2018, 01/03/2020, 12/25/2020   Influenza-Unspecified 01/05/2013, 12/22/2017, 11/05/2021, 11/24/2021   Janssen (J&J) SARS-COV-2 Vaccination 09/02/2019   MMR 01/14/1993, 05/28/2007   Meningococcal Conjugate 05/28/2007   Moderna Sars-Covid-2 Vaccination 05/23/2020   Td (Adult),5 Lf Tetanus Toxid, Preservative Free 05/28/2007   Tdap 05/28/2007, 12/15/2021   Varicella 05/28/2007, 07/10/2008, 04/07/2012    Health Maintenance  Topic Date Due   INFLUENZA VACCINE  10/06/2023   Cervical Cancer Screening (HPV/Pap Cotest)  01/03/2025   DTaP/Tdap/Td (8 - Td or Tdap) 12/16/2031   Hepatitis B Vaccines  Completed   HPV VACCINES  Completed   Hepatitis C Screening  Completed   HIV Screening  Completed   Meningococcal B Vaccine  Aged Out   COVID-19 Vaccine  Discontinued    Patient Care Team: Sharma Coyer, MD as PCP - General (Family Medicine)  Review of Systems  Last CBC Lab Results  Component Value Date   WBC 7.4 07/21/2023   HGB 11.8 07/21/2023   HCT 37.7 07/21/2023   MCV 77.9 (L) 07/21/2023   MCH 24.4 (L) 07/21/2023   RDW 14.3 07/21/2023   PLT 308 07/21/2023   Last metabolic panel Lab Results  Component Value Date   GLUCOSE 106 (H) 10/18/2023   NA 140 10/18/2023   K 4.1 10/18/2023   CL 104 10/18/2023   CO2 22 10/18/2023   BUN 11 10/18/2023   CREATININE 0.99 10/18/2023   EGFR 78 10/18/2023   CALCIUM 9.3 10/18/2023   PROT 6.9 10/18/2023   ALBUMIN 4.3 10/18/2023   LABGLOB 2.6 10/18/2023   BILITOT 0.3 10/18/2023   ALKPHOS 79 10/18/2023   AST 20 10/18/2023   ALT 15 10/18/2023   Last lipids Lab Results  Component Value Date   CHOL 184 11/25/2022   HDL 48 (L) 11/25/2022   LDLCALC 112 (H) 11/25/2022   TRIG 128  11/25/2022   CHOLHDL 3.8 11/25/2022   Last hemoglobin A1c Lab Results  Component Value Date   HGBA1C 5.7 (H) 10/18/2023   Last thyroid functions  Lab Results  Component Value Date   TSH 1.790 10/18/2023   Last vitamin D No results found for: 25OHVITD2, 25OHVITD3, VD25OH Last vitamin B12 and Folate No results found for: VITAMINB12, FOLATE  Results          Objective    BP 104/68 (BP Location: Right Arm, Patient Position: Sitting, Cuff Size: Large)   Pulse 87   Resp 16   Ht 5' 8 (1.727 m)   Wt 239 lb (108.4 kg)   SpO2 100%   BMI 36.34 kg/m  BP Readings from Last 3 Encounters:  10/18/23 104/68  07/20/23 126/84  01/25/23 107/72   Wt Readings from Last 3 Encounters:  10/18/23 239 lb (108.4 kg)  07/20/23 235 lb 12.8 oz (107 kg)  01/25/23 243 lb 6.4 oz (110.4 kg)        Depression Screen    01/25/2023    3:26 PM 11/25/2022    4:37 PM 04/07/2022    3:19 PM 01/06/2022    8:17 AM  PHQ 2/9 Scores  PHQ - 2 Score 0 0 0 0  PHQ- 9 Score 0 0 1    No results found for any visits on 10/18/23.   Physical Exam Physical Exam VITALS: BP- 104/68 MEASUREMENTS: Weight- 239 lbs, BMI- 36.34. HEENT: Moist mucous membranes. NECK: No thyromegaly, no thyroid tenderness. CHEST: Lungs clear to auscultation, no wheezing, no crackles. CARDIOVASCULAR: Regular rate and rhythm. ABDOMEN: Normal bowel sounds. EXTREMITIES: No lower extremity edema.      Assessment & Plan      Problem List Items Addressed This Visit       Cardiovascular and Mediastinum   Migraine - Primary   Relevant Medications   Atogepant  (QULIPTA ) 60 MG TABS     Other   Obesity (BMI 35.0-39.9 without comorbidity)   Relevant Medications   phentermine  37.5 MG capsule   Other Relevant Orders   CMP14+EGFR   Hemoglobin A1c   TSH+T4F+T3Free   Iron  deficiency   High serum low-density lipoprotein (LDL)   Generalized anxiety disorder     Assessment & Plan Chronic migraine Chronic  migraines with a history of multiple medication trials including amitriptyline , propranolol, Topamax, and metoprolol . Currently experiencing 2-3 migraines per week, reduced from 4-5 per week. Migraines are now more hormonal in nature. Qulipta  is effective in aborting migraines when taken as needed. Previous attempts to use [place] were unsuccessful due to prior authorization issues. - Prescribe Qulipta  60 mg daily - Discontinue metoprolol  - Consider [place] if prior authorization can be obtained  Obesity Chronic  Obesity with a BMI of 36.34. Weight has been stable around 230-240 lbs despite diet and exercise efforts. Suspected contribution from PCOS. Previous trial of phentermine  was effective for weight loss. Discussed the use of phentermine , noting it is not a long-term solution and typically cycled on and off. She expressed interest in resuming phentermine . - Prescribe phentermine  37.5 mg daily - Encourage continuation of current diet and exercise regimen - Monitor weight and BMI  Elevated LDL cholesterol Chronic  Elevated LDL cholesterol. Last cholesterol check was in September 2023. No recent abnormalities in thyroid function tests. - Order CMP and lipid panel - Review results and adjust treatment as necessary  Polycystic ovary syndrome (PCOS) - suspected Suspected PCOS due to symptoms of weight gain, facial hair growth, and history of ovarian cysts. Menstrual cycles have been slightly irregular but still within normal range. She has a history of infertility and is seeking further evaluation with a gynecologist. Discussed potential PCOS  management strategies including metformin for insulin resistance and hormonal contraception. - Refer to gynecologist for further evaluation and management - Order hormonal blood work if not already done by gynecologist  Iron  deficiency anemia Iron  deficiency anemia with MCV of 77.9 in May 2025. She takes iron  supplements around her menstrual cycle. Recent  lab work indicated the need for continued monitoring. - Continue iron  supplementation as needed - Order CBC to monitor anemia status  GAD  Pt reports this has resolved, was associated with patient's mother's diagnosis  - no current prescriptions for this problem     Return in about 3 months (around 01/18/2024) for CPE.      Rockie Agent, MD  [facility] [phone] (phone) [phone] (fax)  [facility]

## 2023-10-19 ENCOUNTER — Ambulatory Visit: Payer: Self-pay | Admitting: Family Medicine

## 2023-10-19 LAB — CMP14+EGFR
ALT: 15 IU/L (ref 0–32)
AST: 20 IU/L (ref 0–40)
Albumin: 4.3 g/dL (ref 3.9–4.9)
Alkaline Phosphatase: 79 IU/L (ref 44–121)
BUN/Creatinine Ratio: 11 (ref 9–23)
BUN: 11 mg/dL (ref 6–20)
Bilirubin Total: 0.3 mg/dL (ref 0.0–1.2)
CO2: 22 mmol/L (ref 20–29)
Calcium: 9.3 mg/dL (ref 8.7–10.2)
Chloride: 104 mmol/L (ref 96–106)
Creatinine, Ser: 0.99 mg/dL (ref 0.57–1.00)
Globulin, Total: 2.6 g/dL (ref 1.5–4.5)
Glucose: 106 mg/dL — ABNORMAL HIGH (ref 70–99)
Potassium: 4.1 mmol/L (ref 3.5–5.2)
Sodium: 140 mmol/L (ref 134–144)
Total Protein: 6.9 g/dL (ref 6.0–8.5)
eGFR: 78 mL/min/1.73 (ref >59)

## 2023-10-19 LAB — HEMOGLOBIN A1C
Est. average glucose Bld gHb Est-mCnc: 117 mg/dL
Hgb A1c MFr Bld: 5.7 % — ABNORMAL HIGH (ref 4.8–5.6)

## 2023-10-19 LAB — TSH+T4F+T3FREE
Free T4: 0.92 ng/dL (ref 0.82–1.77)
T3, Free: 2.6 pg/mL (ref 2.0–4.4)
TSH: 1.79 u[IU]/mL (ref 0.450–4.500)

## 2023-11-01 ENCOUNTER — Encounter: Payer: Self-pay | Admitting: Obstetrics and Gynecology

## 2023-11-01 ENCOUNTER — Ambulatory Visit: Admitting: Obstetrics and Gynecology

## 2023-11-01 ENCOUNTER — Other Ambulatory Visit: Payer: Self-pay

## 2023-11-01 VITALS — BP 124/78 | HR 90 | Wt 231.4 lb

## 2023-11-01 DIAGNOSIS — Z3169 Encounter for other general counseling and advice on procreation: Secondary | ICD-10-CM

## 2023-11-01 DIAGNOSIS — N939 Abnormal uterine and vaginal bleeding, unspecified: Secondary | ICD-10-CM

## 2023-11-01 DIAGNOSIS — N914 Secondary oligomenorrhea: Secondary | ICD-10-CM

## 2023-11-01 NOTE — Progress Notes (Signed)
 NEW GYNECOLOGY PATIENT Patient name: Veronica Hines MRN [id number]  Date of birth: 1990/07/05 Chief Complaint:   Gynecologic Exam     History:  Discussed the use of AI scribe software for clinical note transcription with the patient, who gave verbal consent to proceed.  History of Present Illness Veronica Hines is a 33 year old female who presents with concerns about PCOS and infertility.  She has experienced increased hair growth under her chin over the past year, which was not previously an issue. Her menstrual cycles have become longer with heavy bleeding and clotting. Her menstrual bleeding is described as 'super heavy,' requiring pad changes every 30 minutes to an hour for the first three days of her period, which lasts five days. She previously tried a medication starting with 'T' to help with clotting. No bleeding between periods, bleeding with intercourse, or significant pain outside of normal menstrual cramps.  Two weeks ago, she was diagnosed with prediabetes after her primary care physician checked her A1c levels. She is unsure about the necessary steps to confirm a diagnosis of PCOS and is seeking guidance on potential hormonal or vitamin lab tests.  She has been trying to conceive since the birth of her daughter, who is now ten years old, without success. She has not had any recent ultrasounds or used ovulation predictor kits at home. Her partner, who is 83 years old, has no children outside of their relationship and no known health problems, although he uses tobacco.      Gynecologic History Patient's last menstrual period was 10/12/2023. Contraception: none   OB History  Gravida Para Term Preterm AB Living  1 1 1   1   SAB IAB Ectopic Multiple Live Births      1    # Outcome Date GA Lbr Len/2nd Weight Sex Type Anes PTL Lv  1 Term 07/26/13 [redacted]w[redacted]d / 03:34 7 lb 6.7 oz (3.365 kg) F Vag-Spont EPI  LIV     The following portions of the patient's history were reviewed and  updated as appropriate: allergies, current medications, past family history, past medical history, past social history, past surgical history and problem list. Health Maintenance  Topic Date Due   Flu Shot  10/06/2023   Pap with HPV screening  01/03/2025   DTaP/Tdap/Td vaccine (8 - Td or Tdap) 12/16/2031   HPV Vaccine  Completed   Hepatitis C Screening  Completed   HIV Screening  Completed   Pneumococcal Vaccine  Aged Out   Meningitis B Vaccine  Aged Out   Hepatitis B Vaccine  Discontinued   COVID-19 Vaccine  Discontinued     Review of Systems Pertinent items noted in HPI and remainder of comprehensive ROS otherwise negative.  Physical Exam:  BP 124/78   Pulse 90   Wt 231 lb 6.4 oz (105 kg)   LMP 10/12/2023   BMI 35.18 kg/m  Physical Exam Vitals and nursing note reviewed.  Constitutional:      Appearance: Normal appearance.  Cardiovascular:     Rate and Rhythm: Normal rate.  Pulmonary:     Effort: Pulmonary effort is normal.     Breath sounds: Normal breath sounds.  Neurological:     General: No focal deficit present.     Mental Status: She is alert and oriented to person, place, and time.  Psychiatric:        Mood and Affect: Mood normal.        Behavior: Behavior normal.  Thought Content: Thought content normal.        Judgment: Judgment normal.        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 No structural evaluation of the uterus has been done. Medical decision making involves ruling out other conditions and assessing ovarian reserve and hormone levels to guide treatment. - Order labs: Vitamin D , testosterone , progesterone , AMH. - Schedule pelvic ultrasound to evaluate for fibroids, polyps, or cysts. - Consider ovulation induction medication if anovulation is  confirmed. - Discuss potential sperm analysis for partner if ovulation induction is unsuccessful and ultrasound is normal. - Schedule follow-up in 4-6 weeks after ultrasound results.  Prediabetes Diagnosed with prediabetes two weeks ago.   Follow-up: No follow-ups on file.      Carter Quarry, MD Obstetrician & Gynecologist, Faculty Practice [facility] for [employer], [facility]

## 2023-11-08 ENCOUNTER — Ambulatory Visit (HOSPITAL_COMMUNITY)
Admission: RE | Admit: 2023-11-08 | Discharge: 2023-11-08 | Disposition: A | Discharge status: Home / Self Care | Source: Ambulatory Visit | Attending: Obstetrics and Gynecology | Admitting: Obstetrics and Gynecology

## 2023-11-08 DIAGNOSIS — N854 Malposition of uterus: Secondary | ICD-10-CM | POA: Diagnosis not present

## 2023-11-08 DIAGNOSIS — N914 Secondary oligomenorrhea: Secondary | ICD-10-CM | POA: Diagnosis not present

## 2023-11-08 DIAGNOSIS — N939 Abnormal uterine and vaginal bleeding, unspecified: Secondary | ICD-10-CM | POA: Insufficient documentation

## 2023-11-10 LAB — VITAMIN D 1,25 DIHYDROXY
Vitamin D 1, 25 (OH)2 Total: 61 pg/mL
Vitamin D2 1, 25 (OH)2: <10 pg/mL
Vitamin D3 1, 25 (OH)2: 60 pg/mL

## 2023-11-10 LAB — ANTI MULLERIAN HORMONE: ANTI-MULLERIAN HORMONE (AMH): 2.74 ng/mL

## 2023-11-10 LAB — TESTOSTERONE,FREE AND TOTAL
Testosterone, Free: 0.6 pg/mL (ref 0.0–4.2)
Testosterone: 19 ng/dL (ref 8–60)

## 2023-11-10 LAB — PROGESTERONE: Progesterone: 12.9 ng/mL

## 2023-11-15 ENCOUNTER — Ambulatory Visit: Payer: Self-pay | Admitting: Obstetrics and Gynecology

## 2023-11-27 ENCOUNTER — Encounter: Admitting: Family

## 2023-12-14 ENCOUNTER — Ambulatory Visit (INDEPENDENT_AMBULATORY_CARE_PROVIDER_SITE_OTHER): Admitting: Obstetrics and Gynecology

## 2023-12-14 ENCOUNTER — Other Ambulatory Visit: Payer: Self-pay

## 2023-12-14 ENCOUNTER — Other Ambulatory Visit (HOSPITAL_COMMUNITY): Payer: Self-pay

## 2023-12-14 VITALS — BP 137/96 | HR 91 | Wt 228.0 lb

## 2023-12-14 DIAGNOSIS — Z803 Family history of malignant neoplasm of breast: Secondary | ICD-10-CM | POA: Diagnosis not present

## 2023-12-14 DIAGNOSIS — Z1331 Encounter for screening for depression: Secondary | ICD-10-CM | POA: Diagnosis not present

## 2023-12-14 DIAGNOSIS — Z3169 Encounter for other general counseling and advice on procreation: Secondary | ICD-10-CM | POA: Diagnosis not present

## 2023-12-14 DIAGNOSIS — N914 Secondary oligomenorrhea: Secondary | ICD-10-CM | POA: Diagnosis not present

## 2023-12-14 MED ORDER — LETROZOLE 2.5 MG PO TABS
2.5000 mg | ORAL_TABLET | Freq: Every day | ORAL | 3 refills | Status: AC
  Filled 2023-12-14: qty 5, 5d supply, fill #0
  Filled 2024-01-11 – 2024-01-28 (×2): qty 5, 5d supply, fill #1
  Filled 2024-03-06: qty 5, 5d supply, fill #2

## 2023-12-14 NOTE — Progress Notes (Signed)
    GYNECOLOGY VISIT  Patient name: Veronica Hines MRN [id number]  Date of birth: 03/16/1990 Chief Complaint:   Follow-up  History:  Current goal is pregnancy. Period are monthly. Did not feel like the TXA helped. Most recent period was. Has been taking iron  supplement just prior to onset of menses and feels they are not as heavy and most recent cycle was not too heavy and not filling pads. Has not tried a OPK but wants to try.   The following portions of the patient's history were reviewed and updated as appropriate: allergies, current medications, past family history, past medical history, past social history, past surgical history and problem list.   Health Maintenance:   Last pap No results found for: EDMON RUSH, ADEQPAP  Health Maintenance  Topic Date Due   Flu Shot  10/06/2023   Pap with HPV screening  01/03/2025   DTaP/Tdap/Td vaccine (8 - Td or Tdap) 12/16/2031   HPV Vaccine  Completed   Hepatitis C Screening  Completed   HIV Screening  Completed   Pneumococcal Vaccine  Aged Out   Meningitis B Vaccine  Aged Out   Hepatitis B Vaccine  Discontinued   COVID-19 Vaccine  Discontinued      Review of Systems:  Pertinent items are noted in HPI. Comprehensive review of systems was otherwise negative.   Objective:  Physical Exam BP (!) 152/102   Pulse (!) 104   Wt 228 lb (103.4 kg)   LMP 11/29/2023   BMI 34.67 kg/m    Physical Exam Vitals and nursing note reviewed.  Constitutional:      Appearance: Normal appearance.  HENT:     Head: Normocephalic and atraumatic.  Pulmonary:     Effort: Pulmonary effort is normal.  Skin:    General: Skin is warm and dry.  Neurological:     General: No focal deficit present.     Mental Status: She is alert.  Psychiatric:        Mood and Affect: Mood normal.        Behavior: Behavior normal.        Thought Content: Thought content normal.        Judgment: Judgment normal.      Labs and Imaging IMPRESSION: 1.  Endometrial stripe within normal limits measuring 3.8 mm in thickness. If bleeding remains unresponsive to hormonal or medical therapy, sonohysterogram should be considered for focal lesion work-up. (Ref: Radiological Reasoning: Algorithmic Workup of Abnormal Vaginal Bleeding with Endovaginal Sonography and Sonohysterography. AJR 2008; 808:D31-26). 2. Normal right ovary, with nonvisualization of the left ovary. No sonographic features of PCOS. No adnexal mass or free fluid. 3. Normal uterus.     Assessment & Plan:  1. Family history of breast cancer in mother (Primary) Screening mammogram ordered given high risk family history and genetic mutation  - MM 3D SCREENING MAMMOGRAM BILATERAL BREAST; Future  2. Infertility counseling 3. Secondary oligomenorrhea Trial of ovulation induction with OPK to conceive. Increase dose as necessarily. Priro ability to conceive reassuring.   - letrozole (FEMARA) 2.5 MG tablet; Take 1 tablet (2.5 mg total) by mouth daily. Take on days 3 to 7 following a spontaneous menses or progestin-induced bleed.  Dispense: 5 tablet; Refill: 3   Carter Quarry, MD [facility] for [facility], [facility]

## 2023-12-20 ENCOUNTER — Encounter: Payer: Self-pay | Admitting: Obstetrics and Gynecology

## 2023-12-25 ENCOUNTER — Other Ambulatory Visit (HOSPITAL_COMMUNITY): Payer: Self-pay

## 2023-12-25 MED ORDER — FLUZONE 0.5 ML IM SUSY
0.5000 mL | PREFILLED_SYRINGE | Freq: Once | INTRAMUSCULAR | 0 refills | Status: AC
  Filled 2023-12-25: qty 0.5, 1d supply, fill #0

## 2024-01-18 ENCOUNTER — Encounter: Payer: Self-pay | Admitting: Family Medicine

## 2024-01-18 ENCOUNTER — Other Ambulatory Visit (HOSPITAL_BASED_OUTPATIENT_CLINIC_OR_DEPARTMENT_OTHER): Payer: Self-pay

## 2024-01-18 ENCOUNTER — Other Ambulatory Visit (HOSPITAL_COMMUNITY): Payer: Self-pay

## 2024-01-18 ENCOUNTER — Telehealth (HOSPITAL_COMMUNITY): Payer: Self-pay

## 2024-01-18 ENCOUNTER — Ambulatory Visit (INDEPENDENT_AMBULATORY_CARE_PROVIDER_SITE_OTHER): Admitting: Family Medicine

## 2024-01-18 VITALS — BP 129/76 | HR 86 | Temp 98.6°F | Ht 68.0 in | Wt 226.3 lb

## 2024-01-18 DIAGNOSIS — Z0000 Encounter for general adult medical examination without abnormal findings: Secondary | ICD-10-CM | POA: Insufficient documentation

## 2024-01-18 DIAGNOSIS — E669 Obesity, unspecified: Secondary | ICD-10-CM

## 2024-01-18 DIAGNOSIS — R7989 Other specified abnormal findings of blood chemistry: Secondary | ICD-10-CM

## 2024-01-18 DIAGNOSIS — G43711 Chronic migraine without aura, intractable, with status migrainosus: Secondary | ICD-10-CM

## 2024-01-18 MED ORDER — UBRELVY 50 MG PO TABS
ORAL_TABLET | ORAL | 3 refills | Status: DC
  Filled 2024-01-18: qty 9, 30d supply, fill #0

## 2024-01-18 NOTE — Progress Notes (Addendum)
 Complete physical exam   Patient: Veronica Hines   DOB: Sep 02, 1990   33 y.o. Female  MRN: [id number] Visit Date: 01/18/2024  Today's healthcare provider: Rockie Agent, MD   Chief Complaint  Patient presents with  . Annual Exam    Patient is present for annual exam with PCP.  Diet is normal per patient. Doing exercise walking 5x week 30 mins.  Vaccines: UTD Screenings: UTD   Subjective    Veronica Hines is a 33 y.o. female who presents today for a complete physical exam.    She does have additional problems to discuss today.   Discussed the use of AI scribe software for clinical note transcription with the patient, who gave verbal consent to proceed.  History of Present Illness Veronica Hines is a 33 year old female who presents for an annual physical exam.  She has a history of elevated LDL, with the last recorded level being 112 mg/dL one year ago. Her A1c was 5.7% three months ago. She maintains a normal diet and exercises regularly, walking five times a week for 30 minutes.  She was treated with phentermine  37.5 mg for obesity but discontinued it due to palpitations after two weeks. Her BMI has decreased from 36 to 34.4, and her weight has reduced from 240 lbs in August to 226 lbs currently. She attributes some weight loss to stress and skipping meals, eating twice a day.  She has a history of PCOS and was prescribed letrozole 2.5 mg to aid ovulation. However, she did not have a menstrual cycle last month and could not take the medication.  She experiences migraines three times a week, persistent since age 18. She is currently on Qulipta  60 mg, but it is not effective. She has tried taking an extra dose without relief. Previous treatments include metoprolol , propranolol, and Topamax, which caused tingling in her hands and feet. She has not tried Ubrelvy due to insurance denial.  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 She is currently grant-funded and is looking for another job.  Additional info regarding medication trials:   2021-2022 I tried while at [facility] pcp: sumatriptan 40mg , topiramate 50mg , propanol 40mg  and amitriptyline  25mg . Most of these were increased before switching to the next med.    2023 coming to [facility] with the last pcp: amatriptyline increased to 75mg . 2024 started metoprolol  25mg  with the qulipta  60mg  as a back up. 2025 she got me approved for daily qulipta  with a back up of naproxen  for preventive because I was taking 800mg  of ibuprofen  because it helped the most with daily medications.   Past Medical History:  Diagnosis Date  . Abdominal pain, left lower quadrant 07/03/2012  . Amniotic fluid leaking 07/25/2013  . Anemia   . Annual physical exam 01/18/2024  . Anxiety   . Chlamydia   . Family planning counseling 03/05/2012  . GERD (gastroesophageal reflux disease)   . Heart burn 02/27/2012  . Migraine   . Normal delivery 07/26/2013  . Ovarian cyst   . Supervision of normal first pregnancy in first trimester 12/25/2012  . Trichomonal vaginitis 01/29/2013   History reviewed. No pertinent surgical history. Social History   Socioeconomic History  . Marital status: Single    Spouse name: Not on file  . Number of children: Not on file  . Years of education: Not  on file  . Highest education level: Associate degree: academic program  Occupational History  . Not on file  Tobacco Use  . Smoking status: Former    Current packs/day: 0.00    Types: Cigars, Cigarettes    Quit date: 07/30/2010    Years since quitting: 13.4  . Smokeless tobacco: Never  Vaping Use  . Vaping status: Never Used  Substance and Sexual Activity  . Alcohol use: Never  . Drug use: Never  . Sexual activity: Yes    Birth control/protection: None  Other Topics Concern  . Not on file  Social  History Narrative  . Not on file   Social Drivers of Health   Financial Resource Strain: Low Risk  (01/17/2024)   Overall Financial Resource Strain (CARDIA)   . Difficulty of Paying Living Expenses: Not hard at all  Food Insecurity: No Food Insecurity (01/19/2024)   Hunger Vital Sign   . Worried About Programme Researcher, Broadcasting/film/video in the Last Year: Never true   . Ran Out of Food in the Last Year: Never true  Transportation Needs: No Transportation Needs (01/19/2024)   PRAPARE - Transportation   . Lack of Transportation (Medical): No   . Lack of Transportation (Non-Medical): No  Physical Activity: Sufficiently Active (01/17/2024)   Exercise Vital Sign   . Days of Exercise per Week: 5 days   . Minutes of Exercise per Session: 30 min  Stress: No Stress Concern Present (01/17/2024)   [employer] of Occupational Health - Occupational Stress Questionnaire   . Feeling of Stress: Not at all  Social Connections: Moderately Integrated (01/17/2024)   Social Connection and Isolation Panel   . Frequency of Communication with Friends and Family: More than three times a week   . Frequency of Social Gatherings with Friends and Family: Never   . Attends Religious Services: 1 to 4 times per year   . Active Member of Clubs or Organizations: No   . Attends Banker Meetings: Not on file   . Marital Status: Living with partner  Intimate Partner Violence: Unknown (10/30/2020)   Received from [facility] visits prior to 05/07/2022.   Humiliation, Afraid, Rape, and Kick questionnaire   . Within the last year, have you been afraid of your partner or ex-partner?: Patient refused   . Within the last year, have you been humiliated or emotionally abused in other ways by your partner or ex-partner?: Patient refused   . Within the last year, have you been kicked, hit, slapped, or otherwise physically hurt by your partner or ex-partner?: No   . Within the last year, have you been  raped or forced to have any kind of sexual activity by your partner or ex-partner?: No   Family Status  Relation Name Status  . Mother Librarian, Academic (Not Specified)  . Father Michelyn Scullin sr (Not Specified)  . Mat Aunt  (Not Specified)  . Mat Uncle  (Not Specified)  . Bruna Nyhan  (Not Specified)  . Bruna Brigham  (Not Specified)  . MGM Dickey sharps (Not Specified)  . MGF Debby Dinsmore (Not Specified)  . PGM Erminio [employer]  . Brother Toriano [employer]  . Pat Aunt Cassius Dollar Alive  No partnership data on file   Family History  Problem Relation Age of Onset  . Breast cancer Mother   . Depression Mother   . Hypertension Mother   . Anxiety disorder Mother   . Bipolar disorder Mother   .  Cancer Mother   . Bipolar disorder Father   . Alcohol abuse Father   . Depression Father   . Drug abuse Father   . Hypertension Maternal Aunt   . Hypertension Maternal Uncle   . Diabetes Maternal Uncle   . Hypertension Paternal Aunt   . Diabetes Paternal Aunt   . Hypertension Paternal Uncle   . Diabetes Paternal Uncle   . Diabetes Maternal Grandmother   . Cancer Maternal Grandfather        lung  . Diabetes Paternal Grandmother   . Anxiety disorder Brother   . Cancer Paternal Aunt    Allergies  Allergen Reactions  . Phentermine  Palpitations     Medications: Outpatient Medications Prior to Visit  Medication Sig Note  . albuterol (VENTOLIN HFA) 108 (90 Base) MCG/ACT inhaler Inhale 2 puffs into the lungs every 6 (six) hours as needed.   . Atogepant  (QULIPTA ) 60 MG TABS Take 1 tablet (60 mg total) by mouth daily.   . clobetasol cream (TEMOVATE) 0.05 % APPLY A THIN LAYER TO THE AFFECTED AREA(S) BY TOPICAL ROUTE 2 TIMES PER DAY for up to 2 weeks   . letrozole (FEMARA) 2.5 MG tablet Take 1 tablet (2.5 mg total) by mouth daily. Take on days 3 to 7 following a spontaneous menses or progestin-induced bleed.   . naproxen  (NAPROSYN ) 500 MG tablet Take 1 tablet (500 mg total) by mouth as  directed. Take at first sign of headache/migraine with the Qulipta . Can take another Naproxen  pill again in 8 hours if needed.   . [DISCONTINUED] phentermine  37.5 MG capsule Take 1 capsule (37.5 mg total) by mouth every morning. 01/18/2024: caused pt to have palpitations   No facility-administered medications prior to visit.    Review of Systems  Last CBC Lab Results  Component Value Date   WBC 7.4 07/21/2023   HGB 11.8 07/21/2023   HCT 37.7 07/21/2023   MCV 77.9 (L) 07/21/2023   MCH 24.4 (L) 07/21/2023   RDW 14.3 07/21/2023   PLT 308 07/21/2023   Last metabolic panel Lab Results  Component Value Date   GLUCOSE 106 (H) 10/18/2023   NA 140 10/18/2023   K 4.1 10/18/2023   CL 104 10/18/2023   CO2 22 10/18/2023   BUN 11 10/18/2023   CREATININE 0.99 10/18/2023   EGFR 78 10/18/2023   CALCIUM 9.3 10/18/2023   PROT 6.9 10/18/2023   ALBUMIN 4.3 10/18/2023   LABGLOB 2.6 10/18/2023   BILITOT 0.3 10/18/2023   ALKPHOS 79 10/18/2023   AST 20 10/18/2023   ALT 15 10/18/2023   Last lipids Lab Results  Component Value Date   CHOL 184 11/25/2022   HDL 48 (L) 11/25/2022   LDLCALC 112 (H) 11/25/2022   TRIG 128 11/25/2022   CHOLHDL 3.8 11/25/2022  The ASCVD Risk score (Arnett DK, et al., 2019) failed to calculate for the following reasons:   The 2019 ASCVD risk score is only valid for ages 34 to 51  Last hemoglobin A1c Lab Results  Component Value Date   HGBA1C 5.7 (H) 10/18/2023   Last thyroid functions Lab Results  Component Value Date   TSH 1.790 10/18/2023   FREET4 0.92 10/18/2023   Last vitamin D  No results found for: 25OHVITD2, 25OHVITD3, VD25OH Last vitamin B12 and Folate No results found for: VITAMINB12, FOLATE     Objective    BP 129/76 (BP Location: Right Arm, Patient Position: Sitting, Cuff Size: Normal)   Pulse 86   Temp 98.6 F (37  C) (Oral)   Ht 5' 8 (1.727 m)   Wt 226 lb 4.8 oz (102.6 kg)   LMP 01/10/2024 (Exact Date)   SpO2 100%   BMI  34.41 kg/m   BP Readings from Last 3 Encounters:  01/19/24 139/88  01/18/24 129/76  12/14/23 (!) 137/96   Wt Readings from Last 3 Encounters:  01/19/24 227 lb 3.2 oz (103.1 kg)  01/18/24 226 lb 4.8 oz (102.6 kg)  12/14/23 228 lb (103.4 kg)        Physical Exam Vitals reviewed.  Constitutional:      General: She is not in acute distress.    Appearance: Normal appearance. She is not ill-appearing, toxic-appearing or diaphoretic.  HENT:     Head: Normocephalic and atraumatic.     Right Ear: Tympanic membrane and external ear normal. There is no impacted cerumen.     Left Ear: Tympanic membrane and external ear normal. There is no impacted cerumen.     Nose: Nose normal.     Mouth/Throat:     Pharynx: Oropharynx is clear.  Eyes:     General: No scleral icterus.    Extraocular Movements: Extraocular movements intact.     Conjunctiva/sclera: Conjunctivae normal.     Pupils: Pupils are equal, round, and reactive to light.  Cardiovascular:     Rate and Rhythm: Normal rate and regular rhythm.     Pulses: Normal pulses.     Heart sounds: Normal heart sounds. No murmur heard.    No friction rub. No gallop.  Pulmonary:     Effort: Pulmonary effort is normal. No respiratory distress.     Breath sounds: Normal breath sounds. No wheezing, rhonchi or rales.  Abdominal:     General: Bowel sounds are normal. There is no distension.     Palpations: Abdomen is soft. There is no mass.     Tenderness: There is no abdominal tenderness. There is no guarding.  Musculoskeletal:        General: No deformity.     Cervical back: Normal range of motion and neck supple.     Right lower leg: No edema.     Left lower leg: No edema.  Lymphadenopathy:     Cervical: No cervical adenopathy.  Skin:    General: Skin is warm.     Capillary Refill: Capillary refill takes less than 2 seconds.     Findings: No erythema or rash.  Neurological:     General: No focal deficit present.     Mental Status:  She is alert and oriented to person, place, and time.     Cranial Nerves: Cranial nerves 2-12 are intact. No cranial nerve deficit or facial asymmetry.     Motor: Motor function is intact. No weakness.     Gait: Gait normal.  Psychiatric:        Mood and Affect: Mood normal.        Behavior: Behavior normal.       Last depression screening scores    01/19/2024    8:40 AM 01/18/2024    2:22 PM 12/15/2023   10:45 AM  PHQ 2/9 Scores  PHQ - 2 Score 0 0 0  PHQ- 9 Score 0 0 0      Data saved with a previous flowsheet row definition    Last fall risk screening    01/18/2024    2:22 PM  Fall Risk   Falls in the past year? 0  Number falls in past yr: 0  Injury with Fall? 0  Risk for fall due to : No Fall Risks  Follow up Falls evaluation completed    Last Audit-C alcohol use screening    01/17/2024    3:39 PM  Alcohol Use Disorder Test (AUDIT)  1. How often do you have a drink containing alcohol? 0  3. How often do you have six or more drinks on one occasion? 0   A score of 3 or more in women, and 4 or more in men indicates increased risk for alcohol abuse, EXCEPT if all of the points are from question 1   No results found for any visits on 01/18/24.  Assessment & Plan    Routine Health Maintenance and Physical Exam  Immunization History  Administered Date(s) Administered  . DTP 12/16/1991, 03/13/1992, 05/08/1992, 01/14/1993  . DTaP 12/16/1991, 03/13/1992, 05/08/1992, 01/14/1993  . HIB (PRP-T) 12/16/1991, 03/13/1992, 05/08/1992, 01/14/1993  . HPV Quadrivalent 09/19/2005, 04/06/2006, 05/28/2007, 09/04/2012  . Hep A, Unspecified 09/19/2005  . Hep B, Unspecified 09/04/2009  . Hepatitis A, Ped/Adol-2 Dose 09/19/2005, 05/28/2007  . Hepatitis B, PED/ADOLESCENT 02-26-1992, 12/16/1991, 05/08/1992  . IPV 12/16/1991, 03/13/1992, 05/08/1992, 07/10/2008  . Influenza Inj Mdck Quad With Preservative 12/06/2018  . Influenza, Seasonal, Injecte, Preservative Fre 12/17/2007,  02/09/2009, 03/16/2011, 11/25/2022, 12/25/2023  . Influenza,inj,Quad PF,6+ Mos 01/29/2013, 12/22/2017, 12/28/2018, 01/03/2020, 12/25/2020  . Influenza-Unspecified 01/05/2013, 12/22/2017, 11/05/2021, 11/24/2021  . Janssen (J&J) SARS-COV-2 Vaccination 09/02/2019  . MMR 01/14/1993, 05/28/2007  . Meningococcal Conjugate 05/28/2007  . Moderna Sars-Covid-2 Vaccination 05/23/2020  . Td (Adult),5 Lf Tetanus Toxid, Preservative Free 05/28/2007  . Tdap 05/28/2007, 12/15/2021  . Varicella 05/28/2007, 07/10/2008, 04/07/2012    Health Maintenance  Topic Date Due  . Cervical Cancer Screening (HPV/Pap Cotest)  01/03/2025  . DTaP/Tdap/Td (8 - Td or Tdap) 12/16/2031  . Influenza Vaccine  Completed  . HPV VACCINES  Completed  . Hepatitis C Screening  Completed  . HIV Screening  Completed  . Pneumococcal Vaccine  Aged Out  . Meningococcal B Vaccine  Aged Out  . Hepatitis B Vaccines 19-59 Average Risk  Discontinued  . COVID-19 Vaccine  Discontinued    Problem List Items Addressed This Visit     Annual physical exam - Primary   High serum low-density lipoprotein (LDL)   Migraine   Relevant Medications   Ubrogepant (UBRELVY) 50 MG TABS   Other Relevant Orders   Ambulatory referral to Neurology   Obesity (BMI 35.0-39.9 without comorbidity)    Assessment and Plan Assessment & Plan Adult Wellness Visit Annual physical examination conducted. Up to date on healthcare, vaccines, and screenings. Normal diet and regular exercise routine. BMI decreased from 36.34 to 34.41 due to weight loss efforts. - Continue current exercise routine and healthy diet  Obesity BMI decreased from 36.34 to 34.41. Previously on phentermine , discontinued due to palpitations. Weight loss achieved through exercise and dietary changes. - Continue current exercise and dietary regimen  Chronic migraine without aura, intractable Chronic migraines occurring at least three times a week. Current treatment with Qulipta  60 mg is  ineffective. Previous treatments include metoprolol , propranolol, Topamax, and naproxen . Insurance denied [place], but prior authorization will be attempted. Referral to neurology for further management. - Referred to neurology for further migraine management - Attempted prior authorization for Ubrelvy - Continue Qulipta  60 mg until neurology consultation  Iron  deficiency anemia Managed with iron  supplementation. - Continue iron  supplementation  Anxiety disorder Anxiety exacerbated by work-related stress.  Prediabetes A1c was 5.7 three months ago, indicating prediabetes. - Continue  monitoring A1c and dietary habits - Will recheck A1c in February 2026       Return in about 3 months (around 04/19/2024) for Cholesterol, A1c .       Rockie Agent, MD  [facility] [phone] (phone) [phone] (fax)  [facility]

## 2024-01-18 NOTE — Patient Instructions (Signed)
 To keep you healthy, please keep in mind the following health maintenance items that you are due for:   There are no preventive care reminders to display for this patient.   Best Wishes,   Dr. Lang

## 2024-01-19 ENCOUNTER — Ambulatory Visit (INDEPENDENT_AMBULATORY_CARE_PROVIDER_SITE_OTHER): Admitting: Obstetrics and Gynecology

## 2024-01-19 ENCOUNTER — Other Ambulatory Visit (HOSPITAL_COMMUNITY)
Admission: RE | Admit: 2024-01-19 | Discharge: 2024-01-19 | Disposition: A | Source: Ambulatory Visit | Attending: Obstetrics and Gynecology | Admitting: Obstetrics and Gynecology

## 2024-01-19 ENCOUNTER — Other Ambulatory Visit (HOSPITAL_COMMUNITY): Payer: Self-pay

## 2024-01-19 ENCOUNTER — Other Ambulatory Visit: Payer: Self-pay

## 2024-01-19 ENCOUNTER — Encounter: Payer: Self-pay | Admitting: Obstetrics and Gynecology

## 2024-01-19 ENCOUNTER — Telehealth (HOSPITAL_COMMUNITY): Payer: Self-pay

## 2024-01-19 VITALS — BP 139/88 | HR 88 | Wt 227.2 lb

## 2024-01-19 DIAGNOSIS — Z113 Encounter for screening for infections with a predominantly sexual mode of transmission: Secondary | ICD-10-CM | POA: Insufficient documentation

## 2024-01-19 DIAGNOSIS — N914 Secondary oligomenorrhea: Secondary | ICD-10-CM

## 2024-01-19 DIAGNOSIS — Z124 Encounter for screening for malignant neoplasm of cervix: Secondary | ICD-10-CM

## 2024-01-19 DIAGNOSIS — Z803 Family history of malignant neoplasm of breast: Secondary | ICD-10-CM

## 2024-01-19 DIAGNOSIS — Z01419 Encounter for gynecological examination (general) (routine) without abnormal findings: Secondary | ICD-10-CM | POA: Diagnosis not present

## 2024-01-19 NOTE — Telephone Encounter (Signed)
 PA request has been Received. New Encounter has been or will be created for follow up. For additional info see Pharmacy Prior Auth telephone encounter from 01/19/24.

## 2024-01-19 NOTE — Progress Notes (Signed)
 ANNUAL EXAM Patient name: Veronica Hines MRN [id number]  Date of birth: June 19, 1990 Chief Complaint:   Gynecologic Exam  History of Present Illness:   Veronica Hines is a 33 y.o. G1P1001 being seen today for a routine annual exam.  Current complaints: anovulation  Menstrual concerns? Yes  missed October cycles and November cycle shorter than anticipated, 4 days, did not take UPT. Had been the first time in a while where she had missed a cycle. Did not take letrozole as unsure if to take due to shorter cycle. Using OPKs and got a positive but has not checked again since then.  Breast or nipple changes? No  Contraception use? No  Sexually active? Yes   Told by prior gynecologist that when completed childbearing may need hysterectomy given genetic predisposition.  Notes mother had hx of HPV on pap smears and then had HPV on vulva and wondering if that would be the case for her as well.   Patient's last menstrual period was 01/10/2024 (exact date).  Per [facility] records: Mom: rare mutation c. 2010dup [phone]*) found in 1 copy of BRIP1gene: risk of ovarian and brast cancer. ; +BRIP1 heterozygsou gene: high risk ovarian cancer (up to age 84: 5.8% v gen pol 1.0%) also lifetime risk of breast cancer 21.7%, T-C 23%. NCCN guideliens state consider BSO are 45-50 or earlier if ovarian cancer at a young age in family. Breast cancer surviellance at ate 30, can consider mmammo/MRI. Can consider annual pelvic US /ca125 but not explicitily reocmmended. Lifestyle changes  like low fat, plan tbased diet, regular ecercixe, elimante EtOH.   The pregnancy intention screening data noted above was reviewed. Potential methods of contraception were discussed. The patient elected to proceed with No data recorded.   Last pap No results found for: DIAGPAP, HPVHIGH, ADEQPAP Last mammogram: 02/2023  BIRADS 1.  Last colonoscopy: n/a.      01/19/2024    8:40 AM 01/18/2024    2:22 PM 12/15/2023    10:45 AM 11/01/2023    5:20 PM 01/25/2023    3:26 PM  Depression screen PHQ 2/9  Decreased Interest 0 0 0 0 0  Down, Depressed, Hopeless 0 0 0 0 0  PHQ - 2 Score 0 0 0 0 0  Altered sleeping 0 0 0 0 0  Tired, decreased energy 0 0 0 0 0  Change in appetite 0 0 0 0 0  Feeling bad or failure about yourself  0 0 0 0 0  Trouble concentrating 0 0 0 0 0  Moving slowly or fidgety/restless 0 0 0 0 0  Suicidal thoughts 0 0  0 0  PHQ-9 Score 0 0 0  0  0   Difficult doing work/chores     Not difficult at all     Data saved with a previous flowsheet row definition        01/19/2024    8:40 AM 01/18/2024    2:22 PM 12/15/2023   10:45 AM 11/01/2023    5:21 PM  GAD 7 : Generalized Anxiety Score  Nervous, Anxious, on Edge 0 0 0 0  Control/stop worrying 0 0 0 0  Worry too much - different things 0 0 0 0  Trouble relaxing 0 0 0 0  Restless 0 0 0 0  Easily annoyed or irritable 0 0 0 0  Afraid - awful might happen 0 0 0 0  Total GAD 7 Score 0 0 0 0     Review of  Systems:   Pertinent items are noted in HPI Denies any headaches, blurred vision, fatigue, shortness of breath, chest pain, abdominal pain, abnormal vaginal discharge/itching/odor/irritation, problems with periods, bowel movements, urination, or intercourse unless otherwise stated above. Pertinent History Reviewed:  Reviewed past medical,surgical, social and family history.  Reviewed problem list, medications and allergies. Physical Assessment:   Vitals:   01/19/24 0839  BP: 139/88  Pulse: 88  Weight: 227 lb 3.2 oz (103.1 kg)  Body mass index is 34.55 kg/m.        Physical Examination:   General appearance - well appearing, and in no distress  Mental status - alert, oriented to person, place, and time  Psych:  She has a normal mood and affect  Skin - warm and dry, normal color, no suspicious lesions noted  Chest - effort normal, all lung fields clear to auscultation bilaterally  Heart - normal rate and regular  rhythm  Abdomen - soft, nontender, nondistended, no masses or organomegaly  Pelvic -  VULVA: normal appearing vulva with no masses, tenderness or lesions   VAGINA: normal appearing vagina with normal color and discharge, no lesions   CERVIX: normal appearing cervix without discharge or lesions, no CMT  Thin prep pap is done with HR HPV cotesting  UTERUS: uterus is felt to be normal size, shape, consistency and nontender   ADNEXA: No adnexal masses or tenderness noted.  Extremities:  No swelling or varicosities noted  Chaperone present for exam  No results found for this or any previous visit (from the past 24 hours).    Assessment & Plan:  1. Well woman exam with routine gynecological exam (Primary) - Cervical cancer screening: Discussed guidelines. Pap with HPV collected - GC/CT: accepts - Birth Control: none - Breast Health: Encouraged self breast awareness/SBE. Teaching provided.  - F/U 12 months and prn  - Cytology - PAP( [place]) - RPR+HBsAg+HCVAb+...  2. Screening examination for STI - Cytology - PAP( [place]) - RPR+HBsAg+HCVAb+...  3. Screening for cervical cancer Pap collected  - Cytology - PAP( [place])  4. Family history of breast cancer in mother Scheduled for mammogram. Discussed may benefit from rrBSO +/- hysterectomy based on     5. Secondary oligomenorrhea Discussed UPT prior to taking letrozole to rule out pregnancy. If continued oligomenorrhea, may need provera to induce menses.     Orders Placed This Encounter  Procedures   RPR+HBsAg+HCVAb+...    Meds: No orders of the defined types were placed in this encounter.   Follow-up: No follow-ups on file.  Carter Quarry, MD 01/19/2024 9:10 AM

## 2024-01-19 NOTE — Telephone Encounter (Signed)
 Pharmacy Patient Advocate Encounter   Received notification from Pt Calls Messages that prior authorization for Ubrelvy 50MG  tablets  is required/requested.   Insurance verification completed.   The patient is insured through [facility].   Per test claim: PA required; PA submitted to above mentioned insurance via Latent Key/confirmation #/EOC [id number] Status is pending

## 2024-01-22 ENCOUNTER — Other Ambulatory Visit (HOSPITAL_COMMUNITY): Payer: Self-pay

## 2024-01-22 ENCOUNTER — Encounter: Payer: Self-pay | Admitting: Neurology

## 2024-01-23 ENCOUNTER — Ambulatory Visit: Payer: Self-pay | Admitting: Obstetrics and Gynecology

## 2024-01-23 LAB — RPR+HBSAG+HCVAB+...
HIV Screen 4th Generation wRfx: NONREACTIVE
Hep C Virus Ab: NONREACTIVE
Hepatitis B Surface Ag: NEGATIVE
RPR Ser Ql: NONREACTIVE

## 2024-01-23 NOTE — Telephone Encounter (Signed)
 Pharmacy Patient Advocate Encounter  Received notification from [facility] that Prior Authorization for Ubrelvy 50MG  tablets  has been DENIED.  See denial reason below. No denial letter attached in CMM. Will attach denial letter to Media tab once received.   PA #/Case ID/Reference #: [id number]

## 2024-01-24 NOTE — Telephone Encounter (Signed)
 Please review the additional information provided by the patient detailing medications trialed for headache treatment in the past and determine if appeal would be appropriate.

## 2024-01-25 ENCOUNTER — Other Ambulatory Visit (HOSPITAL_COMMUNITY): Payer: Self-pay

## 2024-01-25 ENCOUNTER — Telehealth: Payer: Self-pay | Admitting: Pharmacist

## 2024-01-25 LAB — CYTOLOGY - PAP
Adequacy: ABSENT
Chlamydia: NEGATIVE
Comment: NEGATIVE
Comment: NEGATIVE
Comment: NEGATIVE
Comment: NEGATIVE
Comment: NEGATIVE
Comment: NORMAL
Diagnosis: UNDETERMINED — AB
HPV 16: NEGATIVE
HPV 18 / 45: NEGATIVE
High risk HPV: POSITIVE — AB
Neisseria Gonorrhea: NEGATIVE
Trichomonas: NEGATIVE

## 2024-01-25 NOTE — Telephone Encounter (Signed)
 At this time, an appeal would likely be unsuccessful due to insufficient documentation supporting a prior trial of triptans. Does the patient have any past pharmacy receipts, or other documentation confirming her previous use of sumatriptan?  Thank you, Devere Pandy, PharmD Clinical Pharmacist  [place]  Direct Dial: [phone]

## 2024-02-12 ENCOUNTER — Inpatient Hospital Stay: Admission: RE | Admit: 2024-02-12 | Discharge: 2024-02-12 | Attending: Obstetrics and Gynecology

## 2024-02-12 DIAGNOSIS — Z1231 Encounter for screening mammogram for malignant neoplasm of breast: Secondary | ICD-10-CM | POA: Diagnosis not present

## 2024-02-12 DIAGNOSIS — Z803 Family history of malignant neoplasm of breast: Secondary | ICD-10-CM

## 2024-02-19 ENCOUNTER — Ambulatory Visit: Payer: Self-pay | Admitting: Obstetrics and Gynecology

## 2024-03-06 ENCOUNTER — Telehealth: Payer: Self-pay

## 2024-03-06 ENCOUNTER — Other Ambulatory Visit (HOSPITAL_COMMUNITY): Payer: Self-pay

## 2024-03-06 NOTE — Telephone Encounter (Signed)
 Pharmacy Patient Advocate Encounter  Received notification from [facility] that Prior Authorization for Qulipta  60MG  tablets  has been APPROVED from 03/06/24 to 03/06/25. Ran test claim, Copay is $0. This test claim was processed through [facility]- copay amounts may vary at other pharmacies due to pharmacy/plan contracts, or as the patient moves through the different stages of their insurance plan.   PA #/Case ID/Reference #: [id number]

## 2024-03-06 NOTE — Telephone Encounter (Signed)
 Pharmacy Patient Advocate Encounter   Received notification from Onbase that prior authorization for Qulipta  60MG  tablets  is required/requested.   Insurance verification completed.   The patient is insured through [facility].   Per test claim: PA required; PA submitted to above mentioned insurance via Latent Key/confirmation #/EOC [id number] Status is pending

## 2024-03-15 ENCOUNTER — Encounter: Payer: Self-pay | Admitting: Family Medicine

## 2024-03-16 ENCOUNTER — Encounter: Payer: Self-pay | Admitting: Obstetrics and Gynecology

## 2024-03-18 NOTE — Telephone Encounter (Signed)
 Have scheduled pt for wed at 2pm

## 2024-03-20 ENCOUNTER — Encounter: Payer: Self-pay | Admitting: Family Medicine

## 2024-03-20 ENCOUNTER — Ambulatory Visit: Admitting: Family Medicine

## 2024-03-20 ENCOUNTER — Ambulatory Visit: Attending: Family Medicine

## 2024-03-20 VITALS — BP 131/87 | HR 96 | Temp 99.0°F | Ht 68.0 in | Wt 224.2 lb

## 2024-03-20 DIAGNOSIS — R000 Tachycardia, unspecified: Secondary | ICD-10-CM

## 2024-03-20 DIAGNOSIS — R42 Dizziness and giddiness: Secondary | ICD-10-CM

## 2024-03-20 DIAGNOSIS — E611 Iron deficiency: Secondary | ICD-10-CM | POA: Diagnosis not present

## 2024-03-20 DIAGNOSIS — R0602 Shortness of breath: Secondary | ICD-10-CM

## 2024-03-20 DIAGNOSIS — R5383 Other fatigue: Secondary | ICD-10-CM | POA: Diagnosis not present

## 2024-03-20 DIAGNOSIS — G43719 Chronic migraine without aura, intractable, without status migrainosus: Secondary | ICD-10-CM

## 2024-03-20 DIAGNOSIS — G43009 Migraine without aura, not intractable, without status migrainosus: Secondary | ICD-10-CM

## 2024-03-20 DIAGNOSIS — R4189 Other symptoms and signs involving cognitive functions and awareness: Secondary | ICD-10-CM

## 2024-03-20 DIAGNOSIS — G43711 Chronic migraine without aura, intractable, with status migrainosus: Secondary | ICD-10-CM

## 2024-03-20 MED ORDER — QULIPTA 60 MG PO TABS: 60.0000 mg | ORAL_TABLET | Freq: Every day | ORAL | Status: DC
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
## 2024-03-20 NOTE — Patient Instructions (Signed)
 To keep you healthy, please keep in mind the following health maintenance items that you are due for:   Health Maintenance Due  Topic Date Due   Bone Density Scan  Never done     Best Wishes,   Dr. Lang

## 2024-03-21 LAB — CBC
Hematocrit: 40.7 % (ref 34.0–46.6)
Hemoglobin: 12.8 g/dL (ref 11.1–15.9)
MCH: 24.6 pg — ABNORMAL LOW (ref 26.6–33.0)
MCHC: 31.4 g/dL — ABNORMAL LOW (ref 31.5–35.7)
MCV: 78 fL — ABNORMAL LOW (ref 79–97)
Platelets: 316 x10E3/uL (ref 150–450)
RBC: 5.2 x10E6/uL (ref 3.77–5.28)
RDW: 14.3 % (ref 11.7–15.4)
WBC: 6.2 x10E3/uL (ref 3.4–10.8)

## 2024-03-21 LAB — HEMOGLOBIN A1C
Est. average glucose Bld gHb Est-mCnc: 111 mg/dL
Hgb A1c MFr Bld: 5.5 % (ref 4.8–5.6)

## 2024-03-21 LAB — TSH+FREE T4
Free T4: 1.07 ng/dL (ref 0.82–1.77)
TSH: 2.44 u[IU]/mL (ref 0.450–4.500)

## 2024-03-21 LAB — CMP14+EGFR
ALT: 11 IU/L (ref 0–32)
AST: 18 IU/L (ref 0–40)
Albumin: 4.6 g/dL (ref 3.9–4.9)
Alkaline Phosphatase: 66 IU/L (ref 41–116)
BUN/Creatinine Ratio: 12 (ref 9–23)
BUN: 11 mg/dL (ref 6–20)
Bilirubin Total: 0.9 mg/dL (ref 0.0–1.2)
CO2: 18 mmol/L — ABNORMAL LOW (ref 20–29)
Calcium: 9.3 mg/dL (ref 8.7–10.2)
Chloride: 102 mmol/L (ref 96–106)
Creatinine, Ser: 0.9 mg/dL (ref 0.57–1.00)
Globulin, Total: 2.6 g/dL (ref 1.5–4.5)
Glucose: 77 mg/dL (ref 70–99)
Potassium: 3.9 mmol/L (ref 3.5–5.2)
Sodium: 137 mmol/L (ref 134–144)
Total Protein: 7.2 g/dL (ref 6.0–8.5)
eGFR: 87 mL/min/1.73

## 2024-03-21 LAB — IRON,TIBC AND FERRITIN PANEL
Ferritin: 35 ng/mL (ref 15–150)
Iron Saturation: 33 % (ref 15–55)
Iron: 111 ug/dL (ref 27–159)
Total Iron Binding Capacity: 340 ug/dL (ref 250–450)
UIBC: 229 ug/dL (ref 131–425)

## 2024-03-21 LAB — VITAMIN B12: Vitamin B-12: 384 pg/mL (ref 232–1245)

## 2024-03-21 LAB — FOLATE: Folate: 10.6 ng/mL

## 2024-03-22 ENCOUNTER — Ambulatory Visit: Payer: Self-pay | Admitting: Family Medicine

## 2024-03-27 ENCOUNTER — Ambulatory Visit: Admitting: Obstetrics and Gynecology

## 2024-03-28 ENCOUNTER — Encounter: Payer: Self-pay | Admitting: Family Medicine
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
## 2024-04-05 ENCOUNTER — Telehealth (HOSPITAL_COMMUNITY): Payer: Self-pay

## 2024-04-05 ENCOUNTER — Ambulatory Visit: Admitting: Neurology

## 2024-04-05 ENCOUNTER — Encounter: Payer: Self-pay | Admitting: Neurology

## 2024-04-05 ENCOUNTER — Telehealth (HOSPITAL_BASED_OUTPATIENT_CLINIC_OR_DEPARTMENT_OTHER): Payer: Self-pay

## 2024-04-05 ENCOUNTER — Other Ambulatory Visit (HOSPITAL_COMMUNITY): Payer: Self-pay

## 2024-04-05 ENCOUNTER — Telehealth: Payer: Self-pay

## 2024-04-05 VITALS — BP 115/81 | HR 105 | Ht 67.0 in | Wt 224.0 lb

## 2024-04-05 DIAGNOSIS — G43009 Migraine without aura, not intractable, without status migrainosus: Secondary | ICD-10-CM

## 2024-04-05 MED ORDER — NURTEC 75 MG PO TBDP
1.0000 | ORAL_TABLET | ORAL | 1 refills | Status: AC
  Filled 2024-04-05: qty 45, 90d supply, fill #0

## 2024-04-05 MED ORDER — RIZATRIPTAN BENZOATE 10 MG PO TBDP
10.0000 mg | ORAL_TABLET | ORAL | 1 refills | Status: AC | PRN
  Filled 2024-04-05: qty 27, 30d supply, fill #0

## 2024-04-05 NOTE — Telephone Encounter (Signed)
 Patient seen in office today. Patient to stop Qulipta  and start Nurtec every other day for Preventive.   PA team please start a PA for Nurtec every other day.

## 2024-04-08 ENCOUNTER — Ambulatory Visit: Admitting: Neurology

## 2024-04-09 ENCOUNTER — Telehealth: Payer: Self-pay | Admitting: Pharmacy Technician

## 2024-04-09 ENCOUNTER — Other Ambulatory Visit (HOSPITAL_COMMUNITY): Payer: Self-pay

## 2024-04-09 NOTE — Telephone Encounter (Signed)
 PA has been submitted, and telephone encounter has been created. Please see telephone encounter dated 2.3.26.

## 2024-04-26 ENCOUNTER — Ambulatory Visit: Admitting: Family Medicine

## 2024-04-26 ENCOUNTER — Ambulatory Visit: Admitting: Obstetrics and Gynecology

## 2024-06-25 ENCOUNTER — Ambulatory Visit: Admitting: Family Medicine

## 2024-10-21 ENCOUNTER — Ambulatory Visit: Payer: Self-pay | Admitting: Neurology
